# Patient Record
Sex: Female | Born: 1935 | Race: White | Hispanic: No | Marital: Married | Smoking: Never smoker
Health system: Southern US, Community
[De-identification: ages and names within clinical notes are randomized; demographics above are authoritative.]

## PROBLEM LIST (undated history)

## (undated) DIAGNOSIS — F418 Other specified anxiety disorders: Secondary | ICD-10-CM

## (undated) DIAGNOSIS — I10 Essential (primary) hypertension: Secondary | ICD-10-CM

## (undated) DIAGNOSIS — K219 Gastro-esophageal reflux disease without esophagitis: Secondary | ICD-10-CM

## (undated) DIAGNOSIS — M353 Polymyalgia rheumatica: Secondary | ICD-10-CM

## (undated) DIAGNOSIS — F419 Anxiety disorder, unspecified: Secondary | ICD-10-CM

## (undated) HISTORY — PX: CHOLECYSTECTOMY: SHX55

## (undated) HISTORY — PX: ABDOMINAL HYSTERECTOMY: SHX81

---

## 2015-09-20 ENCOUNTER — Encounter (HOSPITAL_BASED_OUTPATIENT_CLINIC_OR_DEPARTMENT_OTHER): Payer: Self-pay | Admitting: Emergency Medicine

## 2015-09-20 ENCOUNTER — Emergency Department (HOSPITAL_BASED_OUTPATIENT_CLINIC_OR_DEPARTMENT_OTHER)
Admission: EM | Admit: 2015-09-20 | Discharge: 2015-09-20 | Disposition: A | Discharge status: Home / Self Care | Payer: Medicare Other | Attending: Emergency Medicine | Admitting: Emergency Medicine

## 2015-09-20 ENCOUNTER — Other Ambulatory Visit: Payer: Self-pay

## 2015-09-20 ENCOUNTER — Emergency Department (HOSPITAL_BASED_OUTPATIENT_CLINIC_OR_DEPARTMENT_OTHER): Payer: Medicare Other

## 2015-09-20 DIAGNOSIS — I10 Essential (primary) hypertension: Secondary | ICD-10-CM | POA: Diagnosis not present

## 2015-09-20 DIAGNOSIS — R0602 Shortness of breath: Secondary | ICD-10-CM | POA: Insufficient documentation

## 2015-09-20 DIAGNOSIS — R002 Palpitations: Secondary | ICD-10-CM | POA: Diagnosis present

## 2015-09-20 DIAGNOSIS — R0789 Other chest pain: Secondary | ICD-10-CM | POA: Diagnosis not present

## 2015-09-20 HISTORY — DX: Essential (primary) hypertension: I10

## 2015-09-20 LAB — COMPREHENSIVE METABOLIC PANEL
ALBUMIN: 4 g/dL (ref 3.5–5.0)
ALK PHOS: 64 U/L (ref 38–126)
ALT: 24 U/L (ref 14–54)
ANION GAP: 10 (ref 5–15)
AST: 23 U/L (ref 15–41)
BUN: 16 mg/dL (ref 6–20)
CALCIUM: 9.1 mg/dL (ref 8.9–10.3)
CO2: 24 mmol/L (ref 22–32)
Chloride: 99 mmol/L — ABNORMAL LOW (ref 101–111)
Creatinine, Ser: 1.28 mg/dL — ABNORMAL HIGH (ref 0.44–1.00)
GFR calc Af Amer: 43 mL/min — ABNORMAL LOW (ref >60)
GFR calc non Af Amer: 37 mL/min — ABNORMAL LOW (ref >60)
GLUCOSE: 135 mg/dL — AB (ref 65–99)
Potassium: 4.1 mmol/L (ref 3.5–5.1)
SODIUM: 133 mmol/L — AB (ref 135–145)
Total Bilirubin: 0.6 mg/dL (ref 0.3–1.2)
Total Protein: 7.4 g/dL (ref 6.5–8.1)

## 2015-09-20 LAB — CBC WITH DIFFERENTIAL/PLATELET
Basophils Absolute: 0 10*3/uL (ref 0.0–0.1)
Basophils Relative: 0 %
EOS PCT: 2 %
Eosinophils Absolute: 0.2 10*3/uL (ref 0.0–0.7)
HEMATOCRIT: 31.9 % — AB (ref 36.0–46.0)
Hemoglobin: 10.7 g/dL — ABNORMAL LOW (ref 12.0–15.0)
LYMPHS ABS: 3.5 10*3/uL (ref 0.7–4.0)
LYMPHS PCT: 34 %
MCH: 30.4 pg (ref 26.0–34.0)
MCHC: 33.5 g/dL (ref 30.0–36.0)
MCV: 90.6 fL (ref 78.0–100.0)
MONO ABS: 0.9 10*3/uL (ref 0.1–1.0)
MONOS PCT: 8 %
Neutro Abs: 5.8 10*3/uL (ref 1.7–7.7)
Neutrophils Relative %: 56 %
Platelets: 318 10*3/uL (ref 150–400)
RBC: 3.52 MIL/uL — ABNORMAL LOW (ref 3.87–5.11)
RDW: 12.8 % (ref 11.5–15.5)
WBC: 10.4 10*3/uL (ref 4.0–10.5)

## 2015-09-20 LAB — MAGNESIUM: Magnesium: 2.2 mg/dL (ref 1.7–2.4)

## 2015-09-20 LAB — TROPONIN I: Troponin I: <0.03 ng/mL (ref <0.03)

## 2015-09-20 LAB — TSH: TSH: 2.374 u[IU]/mL (ref 0.350–4.500)

## 2015-09-20 MED ORDER — SODIUM CHLORIDE 0.9 % IV BOLUS (SEPSIS)
1000.0000 mL | Freq: Once | INTRAVENOUS | Status: AC
  Administered 2015-09-20: 1000 mL via INTRAVENOUS

## 2015-09-20 NOTE — Discharge Instructions (Signed)
Please see your PCP in 1-2 days for re-evaluation and discuss getting holter monitoring. Your prior stress test, ECHO, CT chest for work-up of this is reassuring. Please return for worsening symptoms, including passing out, worsening pain, difficulty breathing or any other symptoms concerning to you.

## 2015-09-20 NOTE — ED Notes (Signed)
Per Dr Verdie MosherLiu, repeat EKG scheduled for 1700 today along with repeat troponin.

## 2015-09-20 NOTE — ED Notes (Signed)
Patient transported to X-ray 

## 2015-09-20 NOTE — ED Notes (Signed)
Dr Liu in room with pt now. 

## 2015-09-20 NOTE — ED Provider Notes (Signed)
[facility] Provider Note   CSN: [id number] Arrival date & time: 09/20/15  1354  First Provider Contact:   First MD Initiated Contact with Patient 09/20/15 1515    By signing my name below, I, Levon Hedger, attest that this documentation has been prepared under the direction and in the presence of No att. providers found . Electronically Signed: Levon Hedger, Scribe. 09/21/2015. 1:01 AM.    History   Chief Complaint Chief Complaint  Patient presents with  . Chest Pain  . Palpitations    HPI Lindsay Sutton is a 82 y.o. female who presents to the Emergency Department complaining of intermittent, sudden onset, unchanging palpitations onset 3 months ago. Pt also notes associated chest tightness and mild dyspnea. Pt last experienced symptoms this morning at church and stated they lasted for an hour and have since resolved. Typically episodes not as long for several minutes. She denies any chest pain or palpitations during exam. In the past, pt has taken Xanax with some relief. She states symptoms are not exacerbated by exertion. Per pt, she has been to [facility] four times for the same and  admitted twice. There she was prescribed Norvasc and Lopressor and advised to follow up with her PCP following formal US and . Her PCP changed her medication to Bystolic and Benicar which she is currently taking. Pt was given an [place] and a cardiac stress test which were normal. She denies lightheadedness, nausea, or diaphoresis.   The history is provided by the patient. No language interpreter was used.  Palpitations   Associated symptoms include chest pain and shortness of breath. Pertinent negatives include no diaphoresis and no nausea.    Past Medical History:  Diagnosis Date  . Hypertension     There are no active problems to display for this patient.   Past Surgical History:  Procedure Laterality Date  . ABDOMINAL HYSTERECTOMY    . CHOLECYSTECTOMY      OB History    No data available       Home Medications    Prior to Admission medications   Not on File    Family History History reviewed. No pertinent family history.  Social History Social History  Substance Use Topics  . Smoking status: Never Smoker  . Smokeless tobacco: Never Used  . Alcohol use No     Allergies   Dilaudid [hydromorphone] and Morphine and related   Review of Systems Review of Systems  Constitutional: Negative for diaphoresis.  Respiratory: Positive for shortness of breath.   Cardiovascular: Positive for chest pain and palpitations.  Gastrointestinal: Negative for nausea.  Neurological: Negative for light-headedness.   Physical Exam Updated Vital Signs BP 159/89 (BP Location: Right Arm)   Pulse 65   Temp 98 F (36.7 C) (Oral)   Resp 18   Ht 5' (1.524 m)   Wt 160 lb (72.6 kg)   LMP  (LMP Unknown)   SpO2 100%   BMI 31.25 kg/m   Physical Exam Physical Exam  Nursing note and vitals reviewed. Constitutional: Well developed, well nourished, non-toxic, and in no acute distress Head: Normocephalic and atraumatic.  Mouth/Throat: Oropharynx is clear and moist.  Neck: Normal range of motion. Neck supple.  Cardiovascular: Normal rate and regular rhythm.  Trace bilateral pedal edema.  Pulmonary/Chest: Effort normal and breath sounds normal.  Abdominal: Soft. There is no tenderness. There is no rebound and no guarding.  Musculoskeletal: Normal range of motion.  Neurological: Alert, no facial droop, fluent speech,  moves all extremities symmetrically Skin: Skin is warm and dry.  Psychiatric: Cooperative   ED Treatments / Results  DIAGNOSTIC STUDIES:  Oxygen Saturation is 96% on RA, adequate by my interpretation.    COORDINATION OF CARE:  3:21 PM Will order DG chest. Discussed treatment plan with pt at bedside and pt agreed to plan.  Labs (all labs ordered are listed, but only abnormal results are displayed) Labs Reviewed  CBC WITH  DIFFERENTIAL/PLATELET - Abnormal; Notable for the following:       Result Value   RBC 3.52 (*)    Hemoglobin 10.7 (*)    HCT 31.9 (*)    All other components within normal limits  COMPREHENSIVE METABOLIC PANEL - Abnormal; Notable for the following:    Sodium 133 (*)    Chloride 99 (*)    Glucose, Bld 135 (*)    Creatinine, Ser 1.28 (*)    GFR calc non Af Amer 37 (*)    GFR calc Af Amer 43 (*)    All other components within normal limits  TROPONIN I  MAGNESIUM  TSH  TROPONIN I    EKG  EKG Interpretation  Date/Time:  Sunday September 20 2015 14:05:33 EDT Ventricular Rate:  86 PR Interval:  136 QRS Duration: 70 QT Interval:  340 QTC Calculation: 406 R Axis:   60 Text Interpretation:  Sinus rhythm with Premature supraventricular complexes Nonspecific T wave abnormality Abnormal ECG No old tracing to compare Confirmed by BELFI  MD, MELANIE ([id number]) on 09/20/2015 2:24:20 PM       Radiology Dg Chest 2 View  Result Date: 09/20/2015 CLINICAL DATA:  Chest pain EXAM: CHEST  2 VIEW COMPARISON:  None. FINDINGS: The heart size and mediastinal contours are within normal limits. Aortic atherosclerosis is noted. Both lungs are clear. The visualized skeletal structures are unremarkable. IMPRESSION: No active cardiopulmonary disease. Electronically Signed   By: Signa Kell M.D.   On: 09/20/2015 14:44    Procedures Procedures (including critical care time)  Medications Ordered in ED Medications  sodium chloride 0.9 % bolus 1,000 mL (0 mLs Intravenous Stopped 09/20/15 1630)     Initial Impression / Assessment and Plan / ED Course  I have reviewed the triage vital signs and the nursing notes.  Pertinent labs & imaging results that were available during my care of the patient were reviewed by me and considered in my medical decision making (see chart for details).  Clinical Course  Comment By Time  Chart reviewed and records from [facility] reviewed. The patient had 2 ED  visits in May 2017 for palpitations and chest tightness. She was admitted the beginning of May for this, and underwent echocardiogram showing LVH but otherwise unremarkable. She had a nuclear medicine stress test that was low risk and without any ischemic changes. She had a CT angiogram of the chest without any PE or other acute intrathoracic processes. She had a return visit at the end of May for recurrent symptoms, She was noted to have some mild hyponatremia and unremarkable cardiac markers. She has since then followed up with her primary care doctor regarding this issue, and has plans for Holter monitoring in the near future. Lavera Guise, MD 08/37 2564   82 year old female with history of HTN who presents with several month history of intermittent palpitations. Asymptomatic on presentation. Has had low risk NM stress test 2.5 months ago, unremarkable ECHO, and negative CT chest for PE. Heart score 3. Symptoms seems  atypical for ACS. Doubt dissection given ongoing symptoms for so long. Serial troponin negative and serial EKG without ischemic changes. Has had ongoing work-up for these symptoms. She is getting set up for holter monitoring with her PCP. She will follow-up with PCP in 1-2 days. Strict return and follow-up instructions reviewed. She expressed understanding of all discharge instructions and felt comfortable with the plan of care.     Final Clinical Impressions(s) / ED Diagnoses   Final diagnoses:  Palpitations  Chest tightness   I personally performed the services described in this documentation, which was scribed in my presence. The recorded information has been reviewed and is accurate.  New Prescriptions There are no discharge medications for this patient.    Lavera Guiseana Duo Cristyn Crossno, MD 09/21/15 [phone]

## 2015-09-20 NOTE — ED Triage Notes (Signed)
Pt c/o intermittent palpitations and chest pressure for 2 months but have been constant today since 1100. Pt is alert, interactive, in NAD.

## 2016-02-10 ENCOUNTER — Encounter (HOSPITAL_BASED_OUTPATIENT_CLINIC_OR_DEPARTMENT_OTHER): Payer: Self-pay

## 2016-02-10 ENCOUNTER — Emergency Department (HOSPITAL_BASED_OUTPATIENT_CLINIC_OR_DEPARTMENT_OTHER): Payer: Medicare Other

## 2016-02-10 ENCOUNTER — Inpatient Hospital Stay (HOSPITAL_BASED_OUTPATIENT_CLINIC_OR_DEPARTMENT_OTHER)
Admission: EM | Admit: 2016-02-10 | Discharge: 2016-02-13 | DRG: 871 | Disposition: A | Discharge status: Home / Self Care | Payer: Medicare Other | Attending: Internal Medicine | Admitting: Internal Medicine

## 2016-02-10 DIAGNOSIS — Z7952 Long term (current) use of systemic steroids: Secondary | ICD-10-CM

## 2016-02-10 DIAGNOSIS — F418 Other specified anxiety disorders: Secondary | ICD-10-CM | POA: Diagnosis present

## 2016-02-10 DIAGNOSIS — N390 Urinary tract infection, site not specified: Secondary | ICD-10-CM | POA: Diagnosis present

## 2016-02-10 DIAGNOSIS — R32 Unspecified urinary incontinence: Secondary | ICD-10-CM | POA: Diagnosis present

## 2016-02-10 DIAGNOSIS — R000 Tachycardia, unspecified: Secondary | ICD-10-CM | POA: Diagnosis present

## 2016-02-10 DIAGNOSIS — M353 Polymyalgia rheumatica: Secondary | ICD-10-CM | POA: Diagnosis present

## 2016-02-10 DIAGNOSIS — J209 Acute bronchitis, unspecified: Secondary | ICD-10-CM | POA: Diagnosis present

## 2016-02-10 DIAGNOSIS — E860 Dehydration: Secondary | ICD-10-CM | POA: Diagnosis present

## 2016-02-10 DIAGNOSIS — R6510 Systemic inflammatory response syndrome (SIRS) of non-infectious origin without acute organ dysfunction: Secondary | ICD-10-CM

## 2016-02-10 DIAGNOSIS — J207 Acute bronchitis due to echovirus: Secondary | ICD-10-CM | POA: Diagnosis not present

## 2016-02-10 DIAGNOSIS — B961 Klebsiella pneumoniae [K. pneumoniae] as the cause of diseases classified elsewhere: Secondary | ICD-10-CM | POA: Diagnosis present

## 2016-02-10 DIAGNOSIS — Z8249 Family history of ischemic heart disease and other diseases of the circulatory system: Secondary | ICD-10-CM | POA: Diagnosis not present

## 2016-02-10 DIAGNOSIS — N179 Acute kidney failure, unspecified: Secondary | ICD-10-CM | POA: Diagnosis present

## 2016-02-10 DIAGNOSIS — R69 Illness, unspecified: Secondary | ICD-10-CM

## 2016-02-10 DIAGNOSIS — Z90710 Acquired absence of both cervix and uterus: Secondary | ICD-10-CM

## 2016-02-10 DIAGNOSIS — A419 Sepsis, unspecified organism: Secondary | ICD-10-CM | POA: Diagnosis not present

## 2016-02-10 DIAGNOSIS — B9789 Other viral agents as the cause of diseases classified elsewhere: Secondary | ICD-10-CM | POA: Diagnosis present

## 2016-02-10 DIAGNOSIS — E871 Hypo-osmolality and hyponatremia: Secondary | ICD-10-CM | POA: Diagnosis present

## 2016-02-10 DIAGNOSIS — I10 Essential (primary) hypertension: Secondary | ICD-10-CM | POA: Diagnosis present

## 2016-02-10 DIAGNOSIS — Z79899 Other long term (current) drug therapy: Secondary | ICD-10-CM | POA: Diagnosis not present

## 2016-02-10 DIAGNOSIS — J9801 Acute bronchospasm: Secondary | ICD-10-CM | POA: Diagnosis not present

## 2016-02-10 DIAGNOSIS — Z8052 Family history of malignant neoplasm of bladder: Secondary | ICD-10-CM | POA: Diagnosis not present

## 2016-02-10 DIAGNOSIS — R6520 Severe sepsis without septic shock: Secondary | ICD-10-CM | POA: Diagnosis present

## 2016-02-10 DIAGNOSIS — J111 Influenza due to unidentified influenza virus with other respiratory manifestations: Secondary | ICD-10-CM | POA: Insufficient documentation

## 2016-02-10 DIAGNOSIS — K219 Gastro-esophageal reflux disease without esophagitis: Secondary | ICD-10-CM | POA: Diagnosis present

## 2016-02-10 DIAGNOSIS — Z9049 Acquired absence of other specified parts of digestive tract: Secondary | ICD-10-CM | POA: Diagnosis not present

## 2016-02-10 DIAGNOSIS — J9601 Acute respiratory failure with hypoxia: Secondary | ICD-10-CM | POA: Diagnosis present

## 2016-02-10 DIAGNOSIS — D72829 Elevated white blood cell count, unspecified: Secondary | ICD-10-CM

## 2016-02-10 DIAGNOSIS — J9600 Acute respiratory failure, unspecified whether with hypoxia or hypercapnia: Secondary | ICD-10-CM | POA: Insufficient documentation

## 2016-02-10 HISTORY — DX: Anxiety disorder, unspecified: F41.9

## 2016-02-10 HISTORY — DX: Other specified anxiety disorders: F41.8

## 2016-02-10 HISTORY — DX: Gastro-esophageal reflux disease without esophagitis: K21.9

## 2016-02-10 HISTORY — DX: Polymyalgia rheumatica: M35.3

## 2016-02-10 LAB — URINALYSIS, ROUTINE W REFLEX MICROSCOPIC
Glucose, UA: NEGATIVE mg/dL
HGB URINE DIPSTICK: NEGATIVE
KETONES UR: 15 mg/dL — AB
Nitrite: NEGATIVE
PROTEIN: NEGATIVE mg/dL
Specific Gravity, Urine: 1.024 (ref 1.005–1.030)
pH: 5.5 (ref 5.0–8.0)

## 2016-02-10 LAB — COMPREHENSIVE METABOLIC PANEL
ALBUMIN: 3.8 g/dL (ref 3.5–5.0)
ALK PHOS: 45 U/L (ref 38–126)
ALT: 20 U/L (ref 14–54)
AST: 23 U/L (ref 15–41)
Anion gap: 12 (ref 5–15)
BILIRUBIN TOTAL: 0.9 mg/dL (ref 0.3–1.2)
BUN: 18 mg/dL (ref 6–20)
CALCIUM: 9.2 mg/dL (ref 8.9–10.3)
CO2: 24 mmol/L (ref 22–32)
CREATININE: 1.14 mg/dL — AB (ref 0.44–1.00)
Chloride: 94 mmol/L — ABNORMAL LOW (ref 101–111)
GFR calc Af Amer: 49 mL/min — ABNORMAL LOW (ref >60)
GFR calc non Af Amer: 43 mL/min — ABNORMAL LOW (ref >60)
GLUCOSE: 132 mg/dL — AB (ref 65–99)
Potassium: 3.7 mmol/L (ref 3.5–5.1)
Sodium: 130 mmol/L — ABNORMAL LOW (ref 135–145)
TOTAL PROTEIN: 7.1 g/dL (ref 6.5–8.1)

## 2016-02-10 LAB — CBC WITH DIFFERENTIAL/PLATELET
BASOS ABS: 0 10*3/uL (ref 0.0–0.1)
Basophils Relative: 0 %
EOS ABS: 0 10*3/uL (ref 0.0–0.7)
Eosinophils Relative: 0 %
HCT: 37.1 % (ref 36.0–46.0)
Hemoglobin: 12.3 g/dL (ref 12.0–15.0)
Lymphocytes Relative: 30 %
Lymphs Abs: 6.1 10*3/uL — ABNORMAL HIGH (ref 0.7–4.0)
MCH: 31.1 pg (ref 26.0–34.0)
MCHC: 33.2 g/dL (ref 30.0–36.0)
MCV: 93.7 fL (ref 78.0–100.0)
Monocytes Absolute: 0.6 10*3/uL (ref 0.1–1.0)
Monocytes Relative: 3 %
NEUTROS ABS: 13.5 10*3/uL — AB (ref 1.7–7.7)
Neutrophils Relative %: 67 %
PLATELETS: 274 10*3/uL (ref 150–400)
RBC: 3.96 MIL/uL (ref 3.87–5.11)
RDW: 14.8 % (ref 11.5–15.5)
WBC: 20.2 10*3/uL — ABNORMAL HIGH (ref 4.0–10.5)

## 2016-02-10 LAB — I-STAT CG4 LACTIC ACID, ED
LACTIC ACID, VENOUS: 2.52 mmol/L — AB (ref 0.5–1.9)
Lactic Acid, Venous: 1.51 mmol/L (ref 0.5–1.9)
Lactic Acid, Venous: 2.64 mmol/L (ref 0.5–1.9)

## 2016-02-10 LAB — URINALYSIS, MICROSCOPIC (REFLEX)

## 2016-02-10 LAB — PROCALCITONIN: Procalcitonin: 0.22 ng/mL

## 2016-02-10 MED ORDER — ACETAMINOPHEN 325 MG PO TABS
650.0000 mg | ORAL_TABLET | Freq: Once | ORAL | Status: AC
  Administered 2016-02-10: 650 mg via ORAL
  Filled 2016-02-10: qty 2

## 2016-02-10 MED ORDER — CHLORPHENIRAMINE MALEATE 4 MG PO TABS
4.0000 mg | ORAL_TABLET | Freq: Four times a day (QID) | ORAL | Status: DC | PRN
  Filled 2016-02-10: qty 1

## 2016-02-10 MED ORDER — ZOLPIDEM TARTRATE 5 MG PO TABS: 5.0000 mg | ORAL_TABLET | Freq: Every evening | ORAL | Status: DC | PRN

## 2016-02-10 MED ORDER — DEXTROSE 5 % IV SOLN
1.0000 g | Freq: Three times a day (TID) | INTRAVENOUS | Status: DC
  Administered 2016-02-10 – 2016-02-13 (×9): 1 g via INTRAVENOUS
  Filled 2016-02-10 (×12): qty 1

## 2016-02-10 MED ORDER — VITAMIN D 1000 UNITS PO TABS
1000.0000 [IU] | ORAL_TABLET | Freq: Every day | ORAL | Status: DC
  Administered 2016-02-11 – 2016-02-13 (×3): 1000 [IU] via ORAL
  Filled 2016-02-10 (×3): qty 1

## 2016-02-10 MED ORDER — SODIUM CHLORIDE 0.9 % IV BOLUS (SEPSIS)
1000.0000 mL | Freq: Once | INTRAVENOUS | Status: AC
  Administered 2016-02-10: 1000 mL via INTRAVENOUS

## 2016-02-10 MED ORDER — ALBUTEROL SULFATE (2.5 MG/3ML) 0.083% IN NEBU
5.0000 mg | INHALATION_SOLUTION | Freq: Once | RESPIRATORY_TRACT | Status: AC
Start: 2016-02-10 — End: 2016-02-10
  Administered 2016-02-10: 2.5 mg via RESPIRATORY_TRACT
  Administered 2016-02-10: 5 mg via RESPIRATORY_TRACT
  Filled 2016-02-10: qty 6

## 2016-02-10 MED ORDER — AZTREONAM 1 G IJ SOLR
INTRAMUSCULAR | Status: AC
  Filled 2016-02-10: qty 1

## 2016-02-10 MED ORDER — ALBUTEROL SULFATE (2.5 MG/3ML) 0.083% IN NEBU
INHALATION_SOLUTION | RESPIRATORY_TRACT | Status: AC
  Filled 2016-02-10: qty 3

## 2016-02-10 MED ORDER — ESCITALOPRAM OXALATE 20 MG PO TABS
20.0000 mg | ORAL_TABLET | Freq: Every day | ORAL | Status: DC
  Administered 2016-02-11 – 2016-02-13 (×3): 20 mg via ORAL
  Filled 2016-02-10: qty 1
  Filled 2016-02-10 (×2): qty 2

## 2016-02-10 MED ORDER — ALBUTEROL SULFATE (2.5 MG/3ML) 0.083% IN NEBU: 5.0000 mg | INHALATION_SOLUTION | RESPIRATORY_TRACT | Status: DC | PRN

## 2016-02-10 MED ORDER — ALPRAZOLAM 0.5 MG PO TABS: 2.5000 mg | ORAL_TABLET | Freq: Three times a day (TID) | ORAL | Status: DC | PRN

## 2016-02-10 MED ORDER — DICYCLOMINE HCL 20 MG PO TABS: 20.0000 mg | ORAL_TABLET | Freq: Three times a day (TID) | ORAL | Status: DC | PRN

## 2016-02-10 MED ORDER — ONDANSETRON HCL 4 MG/2ML IJ SOLN
4.0000 mg | Freq: Once | INTRAMUSCULAR | Status: AC
  Administered 2016-02-10: 4 mg via INTRAVENOUS
  Filled 2016-02-10: qty 2

## 2016-02-10 MED ORDER — PANTOPRAZOLE SODIUM 40 MG PO TBEC
40.0000 mg | DELAYED_RELEASE_TABLET | Freq: Every day | ORAL | Status: DC
  Administered 2016-02-11 – 2016-02-13 (×3): 40 mg via ORAL
  Filled 2016-02-10 (×3): qty 1

## 2016-02-10 MED ORDER — IPRATROPIUM BROMIDE 0.02 % IN SOLN
0.5000 mg | Freq: Once | RESPIRATORY_TRACT | Status: AC
  Administered 2016-02-10: 0.5 mg via RESPIRATORY_TRACT
  Filled 2016-02-10: qty 2.5

## 2016-02-10 MED ORDER — ALBUTEROL SULFATE (2.5 MG/3ML) 0.083% IN NEBU
INHALATION_SOLUTION | RESPIRATORY_TRACT | Status: AC
  Administered 2016-02-10: 2.5 mg via RESPIRATORY_TRACT
  Filled 2016-02-10: qty 3

## 2016-02-10 MED ORDER — RISAQUAD PO CAPS
1.0000 | ORAL_CAPSULE | Freq: Every day | ORAL | Status: DC
  Administered 2016-02-11 – 2016-02-13 (×3): 1 via ORAL
  Filled 2016-02-10 (×3): qty 1

## 2016-02-10 MED ORDER — VANCOMYCIN HCL IN DEXTROSE 1-5 GM/200ML-% IV SOLN
1000.0000 mg | Freq: Once | INTRAVENOUS | Status: AC
  Administered 2016-02-10: 1000 mg via INTRAVENOUS
  Filled 2016-02-10: qty 200

## 2016-02-10 MED ORDER — SODIUM CHLORIDE 0.9 % IV SOLN
INTRAVENOUS | Status: DC
  Administered 2016-02-11: 01:00:00 via INTRAVENOUS

## 2016-02-10 MED ORDER — CALCIUM CARBONATE-VITAMIN D 500-200 MG-UNIT PO TABS
1.0000 | ORAL_TABLET | Freq: Two times a day (BID) | ORAL | Status: DC
  Administered 2016-02-11 – 2016-02-13 (×4): 1 via ORAL
  Filled 2016-02-10 (×5): qty 1

## 2016-02-10 MED ORDER — HYDRALAZINE HCL 20 MG/ML IJ SOLN
5.0000 mg | INTRAMUSCULAR | Status: DC | PRN
  Administered 2016-02-12: 5 mg via INTRAVENOUS
  Filled 2016-02-10: qty 1

## 2016-02-10 MED ORDER — NAPHAZOLINE-GLYCERIN 0.012-0.2 % OP SOLN: 1.0000 [drp] | Freq: Four times a day (QID) | OPHTHALMIC | Status: DC | PRN

## 2016-02-10 MED ORDER — IPRATROPIUM BROMIDE 0.02 % IN SOLN
0.5000 mg | RESPIRATORY_TRACT | Status: DC
  Administered 2016-02-11: 0.5 mg via RESPIRATORY_TRACT
  Filled 2016-02-10: qty 2.5

## 2016-02-10 MED ORDER — ASPIRIN 81 MG PO CHEW
81.0000 mg | CHEWABLE_TABLET | Freq: Every day | ORAL | Status: DC
  Administered 2016-02-11 – 2016-02-13 (×3): 81 mg via ORAL
  Filled 2016-02-10 (×3): qty 1

## 2016-02-10 MED ORDER — ACETAMINOPHEN 325 MG PO TABS
650.0000 mg | ORAL_TABLET | Freq: Four times a day (QID) | ORAL | Status: DC | PRN
  Administered 2016-02-11 – 2016-02-12 (×2): 650 mg via ORAL
  Filled 2016-02-10 (×2): qty 2

## 2016-02-10 MED ORDER — NEBIVOLOL HCL 2.5 MG PO TABS
1.2500 mg | ORAL_TABLET | Freq: Every day | ORAL | Status: DC
  Administered 2016-02-12 – 2016-02-13 (×2): 1.25 mg via ORAL
  Filled 2016-02-10 (×3): qty 1

## 2016-02-10 MED ORDER — ONDANSETRON HCL 4 MG/2ML IJ SOLN: 4.0000 mg | Freq: Three times a day (TID) | INTRAMUSCULAR | Status: DC | PRN

## 2016-02-10 MED ORDER — SODIUM CHLORIDE 0.9 % IV BOLUS (SEPSIS)
500.0000 mL | Freq: Once | INTRAVENOUS | Status: AC
  Administered 2016-02-10: 500 mL via INTRAVENOUS

## 2016-02-10 MED ORDER — DOCUSATE SODIUM 100 MG PO CAPS: 100.0000 mg | ORAL_CAPSULE | Freq: Two times a day (BID) | ORAL | Status: DC | PRN

## 2016-02-10 MED ORDER — ENOXAPARIN SODIUM 40 MG/0.4ML ~~LOC~~ SOLN
40.0000 mg | Freq: Every day | SUBCUTANEOUS | Status: DC
  Administered 2016-02-11 – 2016-02-13 (×3): 40 mg via SUBCUTANEOUS
  Filled 2016-02-10 (×3): qty 0.4

## 2016-02-10 NOTE — ED Notes (Signed)
Patient given crackers, PB, and sprite. Offered soup, declined.

## 2016-02-10 NOTE — H&P (Signed)
History and Physical    Lindsay Sutton MVH:8[name]:[id number] DOB: 10/08/1928 DOA: 02/10/2016  Referring MD/NP/PA:   PCP: No primary care provider on file.   Patient coming from:  The patient is coming from home.  At baseline, pt is independent for most of ADL.   Chief Complaint: Sore throat, cough, fever, shortness of breath  HPI: Lindsay Fateleanor Sutton is a 82 y.o. female with medical history significant of hypertension, GERD, depression, anxiety, PMR, who presents with sore throat, cough, fever, shortness of breath.  Patient states that she has been having sore throat, cough, fever, SOB for almost 2 weeks, which has worsened in the past several days. She coughs up brownish colored sputum. She also has runny nose and mild chest pain. The chest pain is located in the front chest, constant, 2/10, aggravated by coughing. She has nausea and vomited several times. No abdominal pain or diarrhea. Denies symptoms of UTI or unilateral weakness.  ED Course: pt was found to have WBC 20.2, lactate is up to 2.64, urinalysis with small moderate leukocyte, sodium 130, Cre 1.14, temperature 101.8, tachycardia, tachypnea, oxygen saturation 89% on room air. Chest x-ray showed bronchitis change. Patient is admitted to stepdown as inpatient.  Review of Systems:   General: has fevers, chills, no changes in body weight, has poor appetite, has fatigue HEENT: no blurry vision, hearing changes or sore throat Respiratory: has dyspnea, coughing, wheezing CV: has chest pain, no palpitations GI: has nausea, vomiting, no abdominal pain, diarrhea, constipation GU: no dysuria, burning on urination, increased urinary frequency, hematuria  Ext: no leg edema Neuro: no unilateral weakness, numbness, or tingling, no vision change or hearing loss Skin: no rash, no skin tear. MSK: No muscle spasm, no deformity, no limitation of range of movement in spin Heme: No easy bruising.  Travel history: No recent long distant  travel.  Allergy:  Allergies  Allergen Reactions  . Floxin [Ofloxacin] Anaphylaxis  . Ceftin [Cefuroxime Axetil]   . Ciprofloxacin   . Cymbalta [Duloxetine Hcl]   . Detrol [Tolterodine]   . Dilaudid [Hydromorphone] Itching  . Fish Allergy   . Levaquin [Levofloxacin]   . Mobic [Meloxicam]   . Pantoprazole   . Penicillins   . Percocet [Oxycodone-Acetaminophen]   . Rocephin [Ceftriaxone]   . Septra [Sulfamethoxazole-Trimethoprim]   . Sulfa Antibiotics   . Morphine And Related Rash    Past Medical History:  Diagnosis Date  . Anxiety   . Depression with anxiety   . GERD (gastroesophageal reflux disease)   . Hypertension   . Polymyalgia rheumatica (HCC)     Past Surgical History:  Procedure Laterality Date  . ABDOMINAL HYSTERECTOMY    . CHOLECYSTECTOMY      Social History:  reports that she has never smoked. She has never used smokeless tobacco. She reports that she does not drink alcohol or use drugs.  Family History:  Family History  Problem Relation Age of Onset  . Heart attack Father   . Bladder Cancer Sister      Prior to Admission medications   Medication Sig Start Date End Date Taking? Authorizing Provider  ALPRAZolam (XANAX PO) Take 2.5 mg by mouth 3 (three) times daily as needed.   Yes Historical Provider, MD  aspirin 81 MG chewable tablet Chew by mouth daily.   Yes Historical Provider, MD  Calcium-Magnesium-Vitamin D (CALCIUM 500 PO) Take 1 tablet by mouth 2 (two) times daily.   Yes Historical Provider, MD  chlorpheniramine (CHLOR-TRIMETON) 2 MG/5ML syrup Take 4 mg by  mouth as needed for allergies.   Yes Historical Provider, MD  cholecalciferol (VITAMIN D) 1000 units tablet Take 1,000 Units by mouth daily.   Yes Historical Provider, MD  dicyclomine (BENTYL) 20 MG tablet Take 20 mg by mouth as needed for spasms.   Yes Historical Provider, MD  diphenhydrAMINE (BENADRYL) 25 mg capsule Take 25 mg by mouth at bedtime as needed.   Yes Historical Provider, MD   Docusate Calcium (STOOL SOFTENER PO) Take by mouth as needed.   Yes Historical Provider, MD  escitalopram (LEXAPRO) 20 MG tablet Take 20 mg by mouth daily.   Yes Historical Provider, MD  Naphazoline HCl (NAPHCON OP) Apply 1-4 drops to eye daily.   Yes Historical Provider, MD  nebivolol (BYSTOLIC) 2.5 MG tablet Take 1.25 mg by mouth daily.   Yes Historical Provider, MD  olmesartan (BENICAR) 40 MG tablet Take 40 mg by mouth daily.   Yes Historical Provider, MD  omeprazole (PRILOSEC) 10 MG capsule Take 10 mg by mouth at bedtime.   Yes Historical Provider, MD  Probiotic Product (PROBIOTIC DAILY PO) Take by mouth.   Yes Historical Provider, MD  Psyllium (METAMUCIL FIBER PO) Take by mouth.   Yes Historical Provider, MD    Physical Exam: Vitals:   02/11/16 0300 02/11/16 0330 02/11/16 0400 02/11/16 0409  BP: (!) 169/61 (!) 183/58 (!) 145/57   Pulse: 65 72 94   Resp: 20 19 17    Temp:    100 F (37.8 C)  TempSrc:    Oral  SpO2: 92% 94% 93% 97%  Weight:      Height:       General: Not in acute distress HEENT:       Eyes: PERRL, EOMI, no scleral icterus.       ENT: No discharge from the ears and nose, no pharynx injection, no tonsillar enlargement.        Neck: No JVD, no bruit, no mass felt. Heme: No neck lymph node enlargement. Cardiac: S1/S2, RRR, No murmurs, No gallops or rubs. Respiratory: Has wheezing and rhonchi bilaterally. No rales or rubs. GI: Soft, nondistended, nontender, no rebound pain, no organomegaly, BS present. GU: No hematuria Ext: No pitting leg edema bilaterally. 2+DP/PT pulse bilaterally. Musculoskeletal: No joint deformities, No joint redness or warmth, no limitation of ROM in spin. Skin: No rashes.  Neuro: Alert, oriented X3, cranial nerves II-XII grossly intact, moves all extremities normally.   Psych: Patient is not psychotic, no suicidal or hemocidal ideation.  Labs on Admission: I have personally reviewed following labs and imaging studies  CBC:  Recent  Labs Lab 02/10/16 1358  WBC 20.2*  NEUTROABS 13.5*  HGB 12.3  HCT 37.1  MCV 93.7  PLT 274   Basic Metabolic Panel:  Recent Labs Lab 02/10/16 1358  NA 130*  K 3.7  CL 94*  CO2 24  GLUCOSE 132*  BUN 18  CREATININE 1.14*  CALCIUM 9.2   GFR: Estimated Creatinine Clearance: 32.9 mL/min (by C-G formula based on SCr of 1.14 mg/dL (H)). Liver Function Tests:  Recent Labs Lab 02/10/16 1358  AST 23  ALT 20  ALKPHOS 45  BILITOT 0.9  PROT 7.1  ALBUMIN 3.8   No results for input(s): LIPASE, AMYLASE in the last 168 hours. No results for input(s): AMMONIA in the last 168 hours. Coagulation Profile:  Recent Labs Lab 02/11/16 0004  INR 1.23   Cardiac Enzymes: No results for input(s): CKTOTAL, CKMB, CKMBINDEX, TROPONINI in the last 168 hours. BNP (last 3  results) No results for input(s): PROBNP in the last 8760 hours. HbA1C: No results for input(s): HGBA1C in the last 72 hours. CBG: No results for input(s): GLUCAP in the last 168 hours. Lipid Profile: No results for input(s): CHOL, HDL, LDLCALC, TRIG, CHOLHDL, LDLDIRECT in the last 72 hours. Thyroid Function Tests:  Recent Labs  02/11/16 0231  TSH 2.187   Anemia Panel: No results for input(s): VITAMINB12, FOLATE, FERRITIN, TIBC, IRON, RETICCTPCT in the last 72 hours. Urine analysis:    Component Value Date/Time   COLORURINE AMBER (A) 02/10/2016 1350   APPEARANCEUR CLOUDY (A) 02/10/2016 1350   LABSPEC 1.024 02/10/2016 1350   PHURINE 5.5 02/10/2016 1350   GLUCOSEU NEGATIVE 02/10/2016 1350   HGBUR NEGATIVE 02/10/2016 1350   BILIRUBINUR SMALL (A) 02/10/2016 1350   KETONESUR 15 (A) 02/10/2016 1350   PROTEINUR NEGATIVE 02/10/2016 1350   NITRITE NEGATIVE 02/10/2016 1350   LEUKOCYTESUR SMALL (A) 02/10/2016 1350   Sepsis Labs: @LABRCNTIP (procalcitonin:4,lacticidven:4) ) Recent Results (from the past 240 hour(s))  Blood Culture (routine x 2)     Status: None (Preliminary result)   Collection Time: 02/10/16   1:50 PM  Result Value Ref Range Status   Specimen Description   Final    BLOOD RIGHT ANTECUBITAL [facility]    Special Requests BOTTLES DRAWN AEROBIC AND ANAEROBIC [facility] EACH  Final   Culture PENDING  Incomplete   Report Status PENDING  Incomplete  Blood Culture (routine x 2)     Status: None (Preliminary result)   Collection Time: 02/10/16  2:03 PM  Result Value Ref Range Status   Specimen Description   Final    BLOOD LEFT FOREARM [facility]formed [facility]    Special Requests BOTTLES DRAWN AEROBIC AND ANAEROBIC [facility] EACH  Final   Culture PENDING  Incomplete   Report Status PENDING  Incomplete  MRSA PCR Screening     Status: None   Collection Time: 02/10/16 10:51 PM  Result Value Ref Range Status   MRSA by PCR NEGATIVE NEGATIVE Final    Comment:        The GeneXpert MRSA Assay (FDA approved for NASAL specimens only), is one component of a comprehensive MRSA colonization surveillance program. It is not intended to diagnose MRSA infection nor to guide or monitor treatment for MRSA infections.      Radiological Exams on Admission: Dg Chest 2 View  Result Date: 02/10/2016 CLINICAL DATA:  Sore throat, cough and chills for 5-6 days. EXAM: CHEST  2 VIEW COMPARISON:  09/20/2015 FINDINGS: The cardiac silhouette, mediastinal and hilar contours are within normal limits and stable. Chronic bronchitic type lung changes but no definite acute overlying pulmonary process. Stable mild eventration of both hemidiaphragms. The bony thorax is intact. IMPRESSION: Mild chronic bronchitic type lung changes but no definite acute overlying pulmonary process. Electronically Signed   By: Rudie MeyerP.  Gallerani M.D.   On: 02/10/2016 12:50     EKG: Independently reviewed.  Not done in ED, will get one.   Assessment/Plan Principal Problem:   Acute respiratory failure with hypoxia (HCC) Active Problems:   Acute bronchitis   GERD (gastroesophageal reflux disease)   Hypertension    Depression with anxiety   AKI (acute kidney injury) (HCC)   Hyponatremia   Acute respiratory failure with hypoxia due to acute bronchitis and sepsis:  Pt is septic with leukocytosis, elevated lactate, fever, tachycardia and tachypnea. Hemodynamically stable.  - Will admit to SDU as inpt. - IV Vancomycin and Aztreonam.  -  Solu-Medrol 60 mg twice a day -Check rapid strep - Mucinex for cough  - prn Albuterol Nebs, atrovent for SOB - Urine S. pneumococcal antigen - Follow up blood culture x2, sputum culture and respiratory virus panel, plus Flu pcr - will get Procalcitonin and trend lactic acid level per sepsis protocol - IVF: 2.5L of NS bolus in ED, followed by 75 mL per hour of NS  GERD: -Protonix  Depression and anxiety: Stable, no suicidal or homicidal ideations. -Continue home medications: Xanax, Lexapro  GERD: -Protonix  HTN: -hold Nenica due to AKI and hyponatremia -continue bystolic -IV hydralazine when necessary  Mild AKI: Cre 1.14, Likely due to prerenal secondary to dehydration and continuation of ARB - IVF as above - Follow up renal function by BMP - Hold Benica  Hyponatremia: Most likely due to poor oral intake. - Will check urine sodium, urine osmolality, serum osmolality. - check TSH - IVF as above - check BMP in AM. - hold Benica  DVT ppx: SQ Lovenox Code Status: Full code Family Communication: None at bed side. Disposition Plan:  Anticipate discharge back to previous home environment Consults called:  none Admission status: SDU/inpation       Date of Service 02/11/2016    Lorretta Harp [facility] Pager [phone]  If 7PM-7AM, please contact night-coverage [email or web address] Password [facility] 02/11/2016, 5:39 AM

## 2016-02-10 NOTE — ED Triage Notes (Addendum)
C/o cold s/s-started approx 1-2 weeks ago with sore throat-vomiting started this am-NAD-presents to triage in w/c

## 2016-02-10 NOTE — ED Notes (Signed)
Please call patient daughter when room assignment becomes available.   Lindsay Sutton (cell) [phone] (home) [phone]  PASSWORD: "Greggory StallionGeorge"

## 2016-02-10 NOTE — ED Notes (Signed)
Pt sleeping, resp even/unlabored; sats decreased to 88-89% RA

## 2016-02-10 NOTE — Progress Notes (Signed)
Pharmacy Antibiotic Note  Lindsay Sutton is a 82 y.o. female admitted on 02/10/2016 with PNA.  Pharmacy has been consulted for Vancomycin and Aztreonam dosing. Estimated CrCl ~40 ml/min.  [facility]1730; Aztreonam 1gm IV given ~2145  Plan: Aztreonam 1gm IV q8h Vancomycin 750mg  IV q12h Will f/u micro data, renal function, and pt's clinical condition Vanc trough prn    Temp (24hrs), Avg:100.2 F (37.9 C), Min:99 F (37.2 C), Max:101.8 F (38.8 C)   Recent Labs Lab 02/10/16 1356 02/10/16 1358 02/10/16 1647 02/10/16 1946  WBC  --  20.2*  --   --   CREATININE  --  1.14*  --   --   LATICACIDVEN 1.51  --  2.52* 2.64*    CrCl cannot be calculated (Unknown ideal weight.).    Allergies  Allergen Reactions  . Floxin [Ofloxacin] Anaphylaxis  . Ceftin [Cefuroxime Axetil]   . Ciprofloxacin   . Cymbalta [Duloxetine Hcl]   . Detrol [Tolterodine]   . Dilaudid [Hydromorphone] Itching  . Fish Allergy   . Levaquin [Levofloxacin]   . Mobic [Meloxicam]   . Pantoprazole   . Penicillins   . Percocet [Oxycodone-Acetaminophen]   . Rocephin [Ceftriaxone]   . Septra [Sulfamethoxazole-Trimethoprim]   . Sulfa Antibiotics   . Morphine And Related Rash    Antimicrobials this admission: 12/26 Vanc >>  12/26 Aztreonam >>   Dose adjustments this admission: n/a  Microbiology results: 12/27 BCx x2:  12/27 UCx:    Sputum:    MRSA PCR:   Thank you for allowing pharmacy to be a part of this patient's care.  Christoper Fabianaron Raenah Murley, PharmD, BCPS Clinical pharmacist, pager [phone] 02/10/2016 11:59 PM

## 2016-02-10 NOTE — ED Provider Notes (Signed)
[facility] Provider Note   CSN: [id number] Arrival date & time: 02/10/16  1202     History   Chief Complaint Chief Complaint  Patient presents with  . Cough    HPI Lindsay Sutton is a 82 y.o. female.  HPI  Pt presenting with c/o cough, congestion over the past 5-6 days.  This morning she began to have vomiting- states she has not been drinking much over the past several days and today is not able to keep down liquids.  No abdominal pain, no chest pain.  No difficulty breathing.  She did not know she has a fever, but is 101 here in the ED.  She states today she felt very weak and fatigued.  Her cough is productive of sputum.   Immunizations are up to date.  No recent travel.  There are no other associated systemic symptoms, there are no other alleviating or modifying factors.   Past Medical History:  Diagnosis Date  . Hypertension   . Polymyalgia rheumatica [facility](HCC)     Patient Active Problem List   Diagnosis Date Noted  . Influenza-like illness 02/10/2016    Past Surgical History:  Procedure Laterality Date  . ABDOMINAL HYSTERECTOMY    . CHOLECYSTECTOMY      OB History    No data available       Home Medications    Prior to Admission medications   Medication Sig Start Date End Date Taking? Authorizing Provider  ALPRAZolam (XANAX PO) Take 2.5 mg by mouth 3 (three) times daily as needed.   Yes Historical Provider, MD  aspirin 81 MG chewable tablet Chew by mouth daily.   Yes Historical Provider, MD  Calcium-Magnesium-Vitamin D (CALCIUM 500 PO) Take 1 tablet by mouth 2 (two) times daily.   Yes Historical Provider, MD  chlorpheniramine (CHLOR-TRIMETON) 2 MG/5ML syrup Take 4 mg by mouth as needed for allergies.   Yes Historical Provider, MD  cholecalciferol (VITAMIN D) 1000 units tablet Take 1,000 Units by mouth daily.   Yes Historical Provider, MD  dicyclomine (BENTYL) 20 MG tablet Take 20 mg by mouth as needed for spasms.   Yes Historical Provider, MD    diphenhydrAMINE (BENADRYL) 25 mg capsule Take 25 mg by mouth at bedtime as needed.   Yes Historical Provider, MD  Docusate Calcium (STOOL SOFTENER PO) Take by mouth as needed.   Yes Historical Provider, MD  escitalopram (LEXAPRO) 20 MG tablet Take 20 mg by mouth daily.   Yes Historical Provider, MD  Naphazoline HCl (NAPHCON OP) Apply 1-4 drops to eye daily.   Yes Historical Provider, MD  nebivolol (BYSTOLIC) 2.5 MG tablet Take 1.25 mg by mouth daily.   Yes Historical Provider, MD  olmesartan (BENICAR) 40 MG tablet Take 40 mg by mouth daily.   Yes Historical Provider, MD  omeprazole (PRILOSEC) 10 MG capsule Take 10 mg by mouth at bedtime.   Yes Historical Provider, MD  Probiotic Product (PROBIOTIC DAILY PO) Take by mouth.   Yes Historical Provider, MD  Psyllium (METAMUCIL FIBER PO) Take by mouth.   Yes Historical Provider, MD    Family History No family history on file.  Social History Social History  Substance Use Topics  . Smoking status: Never Smoker  . Smokeless tobacco: Never Used  . Alcohol use No     Allergies   Floxin [ofloxacin]; Ceftin [cefuroxime axetil]; Ciprofloxacin; Cymbalta [duloxetine hcl]; Detrol [tolterodine]; Dilaudid [hydromorphone]; Fish allergy; Levaquin [levofloxacin]; Mobic [meloxicam]; Pantoprazole; Penicillins; Percocet [oxycodone-acetaminophen]; Rocephin [ceftriaxone]; Septra [sulfamethoxazole-trimethoprim];  Sulfa antibiotics; and Morphine and related   Review of Systems Review of Systems  ROS reviewed and all otherwise negative except for mentioned in HPI   Physical Exam Updated Vital Signs BP 117/56   Pulse 116   Temp 99.5 F (37.5 C) (Oral)   Resp 25   LMP  (LMP Unknown)   SpO2 94%  Vitals reviewed Physical Exam Physical Examination: General appearance - alert, well appearing, and in no distress Mental status - alert, oriented to person, place, and time Eyes - no conjunctival injection, no scleral icterus Mouth - mucous membranes dry, OP  clear Neck - supple, no significant adenopathy Chest - BSS, bilateral wheezing, normal respiratory effort Heart - tachycardic rate, regular rhythm, normal S1, S2, no murmurs, rubs, clicks or gallops Abdomen - soft, nontender, nondistended, no masses or organomegaly, nabs Neurological - alert, oriented, normal speech Extremities - peripheral pulses normal, no pedal edema, no clubbing or cyanosis Skin - normal coloration and turgor, no rashes  ED Treatments / Results  Labs (all labs ordered are listed, but only abnormal results are displayed) Labs Reviewed  COMPREHENSIVE METABOLIC PANEL - Abnormal; Notable for the following:       Result Value   Sodium 130 (*)    Chloride 94 (*)    Glucose, Bld 132 (*)    Creatinine, Ser 1.14 (*)    GFR calc non Af Amer 43 (*)    GFR calc Af Amer 49 (*)    All other components within normal limits  CBC WITH DIFFERENTIAL/PLATELET - Abnormal; Notable for the following:    WBC 20.2 (*)    Neutro Abs 13.5 (*)    Lymphs Abs 6.1 (*)    All other components within normal limits  URINALYSIS, ROUTINE W REFLEX MICROSCOPIC - Abnormal; Notable for the following:    Color, Urine AMBER (*)    APPearance CLOUDY (*)    Bilirubin Urine SMALL (*)    Ketones, ur 15 (*)    Leukocytes, UA SMALL (*)    All other components within normal limits  URINALYSIS, MICROSCOPIC (REFLEX) - Abnormal; Notable for the following:    Bacteria, UA FEW (*)    Squamous Epithelial / LPF 6-30 (*)    All other components within normal limits  CULTURE, BLOOD (ROUTINE X 2)  CULTURE, BLOOD (ROUTINE X 2)  URINE CULTURE  RESPIRATORY PANEL BY PCR  PROCALCITONIN  I-STAT CG4 LACTIC ACID, ED  I-STAT CG4 LACTIC ACID, ED    EKG  EKG Interpretation  Date/Time:  Wednesday February 10 2016 12:53:10 EST Ventricular Rate:  106 PR Interval:    QRS Duration: 74 QT Interval:  314 QTC Calculation: 417 R Axis:   49 Text Interpretation:  Sinus tachycardia Atrial premature complexes Nonspecific  repol abnormality, lateral leads Since previous tracing rate faster, no other acute changes Confirmed by Karma GanjaLINKER  MD, Edwyna Dangerfield [phone]([id number]) on 02/10/2016 1:12:27 PM       Radiology Dg Chest 2 View  Result Date: 02/10/2016 CLINICAL DATA:  Sore throat, cough and chills for 5-6 days. EXAM: CHEST  2 VIEW COMPARISON:  09/20/2015 FINDINGS: The cardiac silhouette, mediastinal and hilar contours are within normal limits and stable. Chronic bronchitic type lung changes but no definite acute overlying pulmonary process. Stable mild eventration of both hemidiaphragms. The bony thorax is intact. IMPRESSION: Mild chronic bronchitic type lung changes but no definite acute overlying pulmonary process. Electronically Signed   By: Rudie MeyerP.  Gallerani M.D.   On: 02/10/2016 12:50    Procedures  Procedures (including critical care time)  Medications Ordered in ED Medications  vancomycin (VANCOCIN) IVPB 1000 mg/200 mL premix (1,000 mg Intravenous New Bag/Given 02/10/16 1627)  aztreonam (AZACTAM) 1 g in dextrose 5 % 50 mL IVPB (0 g Intravenous Stopped 02/10/16 1610)  aztreonam (AZACTAM) 1 g injection (not administered)  acetaminophen (TYLENOL) tablet 650 mg (650 mg Oral Given 02/10/16 1225)  sodium chloride 0.9 % bolus 500 mL (0 mLs Intravenous Stopped 02/10/16 1414)  ondansetron (ZOFRAN) injection 4 mg (4 mg Intravenous Given 02/10/16 1412)  albuterol (PROVENTIL) (2.5 MG/3ML) 0.083% nebulizer solution 5 mg (5 mg Nebulization Given 02/10/16 1431)  ipratropium (ATROVENT) nebulizer solution 0.5 mg (0.5 mg Nebulization Given 02/10/16 1431)     Initial Impression / Assessment and Plan / ED Course  I have reviewed the triage vital signs and the nursing notes.  Pertinent labs & imaging results that were available during my care of the patient were reviewed by me and considered in my medical decision making (see chart for details).  Clinical Course   CRITICAL CARE Performed by: Ethelda Chick Total critical care time: 45  minutes Critical care time was exclusive of separately billable procedures and treating other patients. Critical care was necessary to treat or prevent imminent or life-threatening deterioration. Critical care was time spent personally by me on the following activities: development of treatment plan with patient and/or surrogate as well as nursing, discussions with consultants, evaluation of patient's response to treatment, examination of patient, obtaining history from patient or surrogate, ordering and performing treatments and interventions, ordering and review of laboratory studies, ordering and review of radiographic studies, pulse oximetry and re-evaluation of patient's condition.   1:43 PM code sepsis initiated due to HR and fever- however all symptoms are most likely viral in nature-CXR is negative for pneumonia.  Pt has numerous abx allergies- will hold on antibioitics for now- -- will reassess once further labs, urine are resulted.    3:12 PM WBC is elevated at 20K, calling pharmacy now for assistance with abx selection due to so many patient allergies.    3:20 PM d/w pharmacy and decision made to start vanc and aztreonam - most abx classes are not available due to allergy- for respiratory coverage as this the most likely source.    3:54 PM d/w Dr. Konrad Dolores for admission to [facility] to hospitalist service.  He requests stepdown bed, respiratory viral panel as well as procalcitonin - ordered.      Final Clinical Impressions(s) / ED Diagnoses   Final diagnoses:  Influenza-like illness  Bronchospasm  Leukocytosis, unspecified type  SIRS (systemic inflammatory response syndrome) (HCC)    New Prescriptions New Prescriptions   No medications on file     Jerelyn Scott, MD 02/10/16 1713

## 2016-02-10 NOTE — ED Notes (Signed)
ED Provider at bedside. 

## 2016-02-10 NOTE — Progress Notes (Signed)
   Patient coming [facility] for treatment of acute respiratory failure secondary to community acquired pneumonia with SIRS. [facility]  Shelly Flattenavid Shamarion Coots, MD [facility] 02/10/2016, 3:46 PM

## 2016-02-11 ENCOUNTER — Encounter (HOSPITAL_COMMUNITY): Payer: Self-pay

## 2016-02-11 LAB — BLOOD CULTURE ID PANEL (REFLEXED)
Acinetobacter baumannii: NOT DETECTED
CANDIDA GLABRATA: NOT DETECTED
CANDIDA TROPICALIS: NOT DETECTED
Candida albicans: NOT DETECTED
Candida krusei: NOT DETECTED
Candida parapsilosis: NOT DETECTED
Enterobacter cloacae complex: NOT DETECTED
Enterobacteriaceae species: NOT DETECTED
Enterococcus species: NOT DETECTED
Escherichia coli: NOT DETECTED
HAEMOPHILUS INFLUENZAE: NOT DETECTED
KLEBSIELLA PNEUMONIAE: NOT DETECTED
Klebsiella oxytoca: NOT DETECTED
Listeria monocytogenes: NOT DETECTED
Methicillin resistance: NOT DETECTED
NEISSERIA MENINGITIDIS: NOT DETECTED
Proteus species: NOT DETECTED
Pseudomonas aeruginosa: NOT DETECTED
SERRATIA MARCESCENS: NOT DETECTED
STAPHYLOCOCCUS SPECIES: DETECTED — AB
STREPTOCOCCUS SPECIES: NOT DETECTED
Staphylococcus aureus (BCID): NOT DETECTED
Streptococcus agalactiae: NOT DETECTED
Streptococcus pneumoniae: NOT DETECTED
Streptococcus pyogenes: NOT DETECTED

## 2016-02-11 LAB — BASIC METABOLIC PANEL
Anion gap: 7 (ref 5–15)
BUN: 15 mg/dL (ref 6–20)
CHLORIDE: 102 mmol/L (ref 101–111)
CO2: 23 mmol/L (ref 22–32)
CREATININE: 1.04 mg/dL — AB (ref 0.44–1.00)
Calcium: 7.8 mg/dL — ABNORMAL LOW (ref 8.9–10.3)
GFR calc Af Amer: 55 mL/min — ABNORMAL LOW (ref >60)
GFR calc non Af Amer: 48 mL/min — ABNORMAL LOW (ref >60)
Glucose, Bld: 96 mg/dL (ref 65–99)
Potassium: 4.3 mmol/L (ref 3.5–5.1)
Sodium: 132 mmol/L — ABNORMAL LOW (ref 135–145)

## 2016-02-11 LAB — RESPIRATORY PANEL BY PCR
ADENOVIRUS-RVPPCR: NOT DETECTED
Bordetella pertussis: NOT DETECTED
CORONAVIRUS 229E-RVPPCR: NOT DETECTED
CORONAVIRUS HKU1-RVPPCR: NOT DETECTED
CORONAVIRUS NL63-RVPPCR: NOT DETECTED
CORONAVIRUS OC43-RVPPCR: DETECTED — AB
Chlamydophila pneumoniae: NOT DETECTED
INFLUENZA B-RVPPCR: NOT DETECTED
Influenza A: NOT DETECTED
MYCOPLASMA PNEUMONIAE-RVPPCR: NOT DETECTED
Metapneumovirus: NOT DETECTED
PARAINFLUENZA VIRUS 1-RVPPCR: NOT DETECTED
Parainfluenza Virus 2: NOT DETECTED
Parainfluenza Virus 3: NOT DETECTED
Parainfluenza Virus 4: NOT DETECTED
Respiratory Syncytial Virus: NOT DETECTED
Rhinovirus / Enterovirus: DETECTED — AB

## 2016-02-11 LAB — SODIUM, URINE, RANDOM: Sodium, Ur: 143 mmol/L

## 2016-02-11 LAB — STREP PNEUMONIAE URINARY ANTIGEN: STREP PNEUMO URINARY ANTIGEN: NEGATIVE

## 2016-02-11 LAB — INFLUENZA PANEL BY PCR (TYPE A & B)
Influenza A By PCR: NEGATIVE
Influenza B By PCR: NEGATIVE

## 2016-02-11 LAB — CBC
HCT: 31.5 % — ABNORMAL LOW (ref 36.0–46.0)
Hemoglobin: 10.5 g/dL — ABNORMAL LOW (ref 12.0–15.0)
MCH: 30.6 pg (ref 26.0–34.0)
MCHC: 33.3 g/dL (ref 30.0–36.0)
MCV: 91.8 fL (ref 78.0–100.0)
PLATELETS: 246 10*3/uL (ref 150–400)
RBC: 3.43 MIL/uL — ABNORMAL LOW (ref 3.87–5.11)
RDW: 15.2 % (ref 11.5–15.5)
WBC: 12 10*3/uL — ABNORMAL HIGH (ref 4.0–10.5)

## 2016-02-11 LAB — MRSA PCR SCREENING: MRSA BY PCR: NEGATIVE

## 2016-02-11 LAB — APTT: APTT: 36 s (ref 24–36)

## 2016-02-11 LAB — OSMOLALITY: Osmolality: 282 mOsm/kg (ref 275–295)

## 2016-02-11 LAB — LACTIC ACID, PLASMA
Lactic Acid, Venous: 1.9 mmol/L (ref 0.5–1.9)
Lactic Acid, Venous: 1.3 mmol/L (ref 0.5–1.9)

## 2016-02-11 LAB — PROTIME-INR
INR: 1.23
Prothrombin Time: 15.6 seconds — ABNORMAL HIGH (ref 11.4–15.2)

## 2016-02-11 LAB — OSMOLALITY, URINE: Osmolality, Ur: 562 mOsm/kg (ref 300–900)

## 2016-02-11 LAB — TSH: TSH: 2.187 u[IU]/mL (ref 0.350–4.500)

## 2016-02-11 LAB — RAPID STREP SCREEN (MED CTR MEBANE ONLY): STREPTOCOCCUS, GROUP A SCREEN (DIRECT): NEGATIVE

## 2016-02-11 MED ORDER — IPRATROPIUM-ALBUTEROL 0.5-2.5 (3) MG/3ML IN SOLN
3.0000 mL | Freq: Three times a day (TID) | RESPIRATORY_TRACT | Status: DC
  Administered 2016-02-11 – 2016-02-13 (×4): 3 mL via RESPIRATORY_TRACT
  Filled 2016-02-11 (×4): qty 3

## 2016-02-11 MED ORDER — IPRATROPIUM-ALBUTEROL 0.5-2.5 (3) MG/3ML IN SOLN
3.0000 mL | Freq: Four times a day (QID) | RESPIRATORY_TRACT | Status: DC
  Administered 2016-02-11 (×2): 3 mL via RESPIRATORY_TRACT
  Filled 2016-02-11 (×2): qty 3

## 2016-02-11 MED ORDER — ALPRAZOLAM 0.25 MG PO TABS
0.1250 mg | ORAL_TABLET | Freq: Three times a day (TID) | ORAL | Status: DC | PRN
Start: 2016-02-11 — End: 2016-02-13
  Administered 2016-02-13: 0.125 mg via ORAL
  Filled 2016-02-11: qty 1

## 2016-02-11 MED ORDER — DM-GUAIFENESIN ER 30-600 MG PO TB12
1.0000 | ORAL_TABLET | Freq: Two times a day (BID) | ORAL | Status: DC
  Administered 2016-02-11 (×2): 1 via ORAL
  Filled 2016-02-11 (×5): qty 1

## 2016-02-11 MED ORDER — METHYLPREDNISOLONE SODIUM SUCC 125 MG IJ SOLR
60.0000 mg | Freq: Two times a day (BID) | INTRAMUSCULAR | Status: DC
  Administered 2016-02-11 – 2016-02-13 (×5): 60 mg via INTRAVENOUS
  Filled 2016-02-11 (×5): qty 2

## 2016-02-11 MED ORDER — VANCOMYCIN HCL IN DEXTROSE 750-5 MG/150ML-% IV SOLN
750.0000 mg | Freq: Two times a day (BID) | INTRAVENOUS | Status: DC
  Administered 2016-02-11: 750 mg via INTRAVENOUS
  Filled 2016-02-11 (×2): qty 150

## 2016-02-11 NOTE — Progress Notes (Addendum)
PROGRESS NOTE    Lindsay Fateleanor Sutton  ZOX:[id number] DOB: 05/21/1928 DOA: 02/10/2016 PCP: No primary care provider on file.   Brief Narrative: Lindsay Sutton is a 82 y.o. female with medical history significant of hypertension, GERD, depression, anxiety, PMR, who presents with sore throat, cough, fever, shortness of breath.  pt was found to have WBC 20.2, lactate is up to 2.64, urinalysis with small moderate leukocyte, sodium 130, Cre 1.14, temperature 101.8, tachycardia, tachypnea, oxygen saturation 89% on room air. Chest x-ray showed bronchitis change. Patient is admitted to stepdown as inpatient.   Assessment & Plan:   Principal Problem:   Acute respiratory failure with hypoxia (HCC) Active Problems:   Acute bronchitis   GERD (gastroesophageal reflux disease)   Hypertension   Depression with anxiety   AKI (acute kidney injury) (HCC)   Hyponatremia  Acute respiratory failure with hypoxia due to acute bronchitis and sepsis:  Pt is septic with leukocytosis, elevated lactate, fever, tachycardia and tachypnea. - continue with IV Vancomycin and Aztreonam.  -Solu-Medrol 60 mg twice a day -rapid strep negative  - schedule nebulizer.  - Urine S. pneumococcal antigen negative  -influenza negative  -NSL.  -follow culture. Blood culture one out of 2 positive for staph, but aureus negative.  -urine culture positive for klebsiella.  Respiratory  panel positive for rhinovirus, coronavirus   Depression and anxiety:  -Continue home medications: Xanax, Lexapro   HTN: -hold   benicar due to AKI and hyponatremia -continue bystolic -IV hydralazine when necessary  Mild AKI: Cre 1.14, Likely due to prerenal secondary to dehydration and continuation of ARB - Hold Benica  Hyponatremia: Most likely due to poor oral intake. - improved with fluids.  - hold Benica -TSH 2.1   DVT prophylaxis:  Lovenox Code Status: Full code.  Family Communication: care discussed with patient.    Disposition Plan: remain inpatient   Consultants:   none   Procedures: none   Antimicrobials: Vancomycin, aztreonam    Subjective: She is feeling better, still coughing. Breathing better.   Objective: Vitals:   02/11/16 0530 02/11/16 0600 02/11/16 0630 02/11/16 0746  BP: (!) 149/55 (!) 127/47 (!) 127/39 (!) 156/56  Pulse: 94 86 87 93  Resp: 19 17 18 17   Temp:    98.6 F (37 C)  TempSrc:    Oral  SpO2: 95% 93% 93% 97%  Weight:      Height:        Intake/Output Summary (Last 24 hours) at 02/11/16 0757 Last data filed at 02/11/16 0600  Gross per 24 hour  Intake          3483.33 ml  Output              203 ml  Net          3280.33 ml   Filed Weights   02/10/16 2230  Weight: 75.9 kg (167 lb 5.3 oz)    Examination:  General exam: Appears calm and comfortable  Respiratory system: Clear to auscultation. Respiratory effort normal. Cardiovascular system: S1 & S2 heard, RRR. No JVD, murmurs, rubs, gallops or clicks. No pedal edema. Gastrointestinal system: Abdomen is nondistended, soft and nontender. No organomegaly or masses felt. Normal bowel sounds heard. Central nervous system: Alert and oriented. No focal neurological deficits. Extremities: Symmetric 5 x 5 power. Skin: No rashes, lesions or ulcers Psychiatry: Judgement and insight appear normal. Mood & affect appropriate.     Data Reviewed: I have personally reviewed following labs and imaging studies  CBC:  Recent Labs Lab 02/10/16 1358  WBC 20.2*  NEUTROABS 13.5*  HGB 12.3  HCT 37.1  MCV 93.7  PLT 274   Basic Metabolic Panel:  Recent Labs Lab 02/10/16 1358  NA 130*  K 3.7  CL 94*  CO2 24  GLUCOSE 132*  BUN 18  CREATININE 1.14*  CALCIUM 9.2   GFR: Estimated Creatinine Clearance: 32.9 mL/min (by C-G formula based on SCr of 1.14 mg/dL (H)). Liver Function Tests:  Recent Labs Lab 02/10/16 1358  AST 23  ALT 20  ALKPHOS 45  BILITOT 0.9  PROT 7.1  ALBUMIN 3.8   No results for  input(s): LIPASE, AMYLASE in the last 168 hours. No results for input(s): AMMONIA in the last 168 hours. Coagulation Profile:  Recent Labs Lab 02/11/16 0004  INR 1.23   Cardiac Enzymes: No results for input(s): CKTOTAL, CKMB, CKMBINDEX, TROPONINI in the last 168 hours. BNP (last 3 results) No results for input(s): PROBNP in the last 8760 hours. HbA1C: No results for input(s): HGBA1C in the last 72 hours. CBG: No results for input(s): GLUCAP in the last 168 hours. Lipid Profile: No results for input(s): CHOL, HDL, LDLCALC, TRIG, CHOLHDL, LDLDIRECT in the last 72 hours. Thyroid Function Tests:  Recent Labs  02/11/16 0231  TSH 2.187   Anemia Panel: No results for input(s): VITAMINB12, FOLATE, FERRITIN, TIBC, IRON, RETICCTPCT in the last 72 hours. Sepsis Labs:  Recent Labs Lab 02/10/16 1647 02/10/16 1750 02/10/16 1946 02/11/16 0004 02/11/16 0231  PROCALCITON  --  0.22  --   --   --   LATICACIDVEN 2.52*  --  2.64* 1.9 1.3    Recent Results (from the past 240 hour(s))  Blood Culture (routine x 2)     Status: None (Preliminary result)   Collection Time: 02/10/16  1:50 PM  Result Value Ref Range Status   Specimen Description   Final    BLOOD RIGHT ANTECUBITAL Performed at [facility]    Special Requests BOTTLES DRAWN AEROBIC AND ANAEROBIC [facility]  Final   Culture PENDING  Incomplete   Report Status PENDING  Incomplete  Blood Culture (routine x 2)     Status: None (Preliminary result)   Collection Time: 02/10/16  2:03 PM  Result Value Ref Range Status   Specimen Description   Final    BLOOD LEFT FOREARM Performed at [facility]    Special Requests BOTTLES DRAWN AEROBIC AND ANAEROBIC [facility]  Final   Culture PENDING  Incomplete   Report Status PENDING  Incomplete  MRSA PCR Screening     Status: None   Collection Time: 02/10/16 10:51 PM  Result Value Ref Range Status   MRSA by PCR NEGATIVE NEGATIVE Final    Comment:        The GeneXpert MRSA  Assay (FDA approved for NASAL specimens only), is one component of a comprehensive MRSA colonization surveillance program. It is not intended to diagnose MRSA infection nor to guide or monitor treatment for MRSA infections.   Rapid strep screen (not at [facility])     Status: None   Collection Time: 02/11/16  4:37 AM  Result Value Ref Range Status   Streptococcus, Group A Screen (Direct) NEGATIVE NEGATIVE Final    Comment: (NOTE) A Rapid Antigen test may result negative if the antigen level in the sample is below the detection level of this test. The FDA has not cleared this test as a stand-alone test therefore the rapid antigen negative result has reflexed to  a Group A Strep culture.          Radiology Studies: Dg Chest 2 View  Result Date: 02/10/2016 CLINICAL DATA:  Sore throat, cough and chills for 5-6 days. EXAM: CHEST  2 VIEW COMPARISON:  09/20/2015 FINDINGS: The cardiac silhouette, mediastinal and hilar contours are within normal limits and stable. Chronic bronchitic type lung changes but no definite acute overlying pulmonary process. Stable mild eventration of both hemidiaphragms. The bony thorax is intact. IMPRESSION: Mild chronic bronchitic type lung changes but no definite acute overlying pulmonary process. Electronically Signed   By: Rudie MeyerP.  Gallerani M.D.   On: 02/10/2016 12:50        Scheduled Meds: . acidophilus  1 capsule Oral Daily  . aspirin  81 mg Oral Daily  . aztreonam  1 g Intravenous Q8H  . aztreonam      . calcium-vitamin D  1 tablet Oral BID WC  . cholecalciferol  1,000 Units Oral Daily  . dextromethorphan-guaiFENesin  1 tablet Oral BID  . enoxaparin (LOVENOX) injection  40 mg Subcutaneous Daily  . escitalopram  20 mg Oral Daily  . ipratropium  0.5 mg Nebulization Q4H  . methylPREDNISolone (SOLU-MEDROL) injection  60 mg Intravenous BID  . nebivolol  1.25 mg Oral Daily  . pantoprazole  40 mg Oral Daily  . vancomycin  750 mg Intravenous Q12H    Continuous Infusions: . sodium chloride 75 mL/hr at 02/11/16 0740     LOS: 1 day    Time spent: 35 minutes.     Alba Coryegalado, Ediel Unangst A, MD [facility] Pager [phone]  If 7PM-7AM, please contact night-coverage [email or web address] Password [email or web address] 02/11/2016, 7:57 AM

## 2016-02-11 NOTE — Progress Notes (Signed)
  PHARMACY - PHYSICIAN COMMUNICATION CRITICAL VALUE ALERT - BLOOD CULTURE IDENTIFICATION (BCID)  Results for orders placed or performed during the hospital encounter of 02/10/16  Blood Culture ID Panel (Reflexed) (Collected: 02/10/2016  1:50 PM)  Result Value Ref Range   Enterococcus species NOT DETECTED NOT DETECTED   Listeria monocytogenes NOT DETECTED NOT DETECTED   Staphylococcus species DETECTED (A) NOT DETECTED   Staphylococcus aureus NOT DETECTED NOT DETECTED   Methicillin resistance NOT DETECTED NOT DETECTED   Streptococcus species NOT DETECTED NOT DETECTED   Streptococcus agalactiae NOT DETECTED NOT DETECTED   Streptococcus pneumoniae NOT DETECTED NOT DETECTED   Streptococcus pyogenes NOT DETECTED NOT DETECTED   Acinetobacter baumannii NOT DETECTED NOT DETECTED   Enterobacteriaceae species NOT DETECTED NOT DETECTED   Enterobacter cloacae complex NOT DETECTED NOT DETECTED   Escherichia coli NOT DETECTED NOT DETECTED   Klebsiella oxytoca NOT DETECTED NOT DETECTED   Klebsiella pneumoniae NOT DETECTED NOT DETECTED   Proteus species NOT DETECTED NOT DETECTED   Serratia marcescens NOT DETECTED NOT DETECTED   Haemophilus influenzae NOT DETECTED NOT DETECTED   Neisseria meningitidis NOT DETECTED NOT DETECTED   Pseudomonas aeruginosa NOT DETECTED NOT DETECTED   Candida albicans NOT DETECTED NOT DETECTED   Candida glabrata NOT DETECTED NOT DETECTED   Candida krusei NOT DETECTED NOT DETECTED   Candida parapsilosis NOT DETECTED NOT DETECTED   Candida tropicalis NOT DETECTED NOT DETECTED    Name of physician (or Provider) Contacted: Dr. Sunnie Nielsenegalado  Changes to prescribed antibiotics required: 1/2 Blood cultures with likely MSSE. Patient also with concern for PNA - on Vancomycin + Azactam. MRSA PCR negative and RVP + Coronavirus + Rhinovirus/Enterovirus. Recommendation was made to discontinue Vancomycin at this time and was accepted.   Thank you for allowing pharmacy to be a part of this  patient's care.  Georgina PillionElizabeth Ted Leonhart, PharmD, BCPS Clinical Pharmacist Pager: [phone] 02/11/2016 4:21 PM

## 2016-02-12 LAB — BASIC METABOLIC PANEL
ANION GAP: 11 (ref 5–15)
BUN: 14 mg/dL (ref 6–20)
CALCIUM: 8.5 mg/dL — AB (ref 8.9–10.3)
CO2: 19 mmol/L — ABNORMAL LOW (ref 22–32)
Chloride: 101 mmol/L (ref 101–111)
Creatinine, Ser: 0.93 mg/dL (ref 0.44–1.00)
GFR, EST NON AFRICAN AMERICAN: 54 mL/min — AB (ref >60)
Glucose, Bld: 137 mg/dL — ABNORMAL HIGH (ref 65–99)
Potassium: 4.2 mmol/L (ref 3.5–5.1)
Sodium: 131 mmol/L — ABNORMAL LOW (ref 135–145)

## 2016-02-12 LAB — URINE CULTURE: Culture: >=100000 — AB

## 2016-02-12 LAB — CBC
HCT: 32.8 % — ABNORMAL LOW (ref 36.0–46.0)
Hemoglobin: 11.1 g/dL — ABNORMAL LOW (ref 12.0–15.0)
MCH: 30.7 pg (ref 26.0–34.0)
MCHC: 33.8 g/dL (ref 30.0–36.0)
MCV: 90.9 fL (ref 78.0–100.0)
Platelets: 262 10*3/uL (ref 150–400)
RBC: 3.61 MIL/uL — ABNORMAL LOW (ref 3.87–5.11)
RDW: 14.3 % (ref 11.5–15.5)
WBC: 13.6 10*3/uL — AB (ref 4.0–10.5)

## 2016-02-12 LAB — PROCALCITONIN: PROCALCITONIN: 0.26 ng/mL

## 2016-02-12 MED ORDER — IRBESARTAN 75 MG PO TABS
37.5000 mg | ORAL_TABLET | Freq: Every day | ORAL | Status: DC
  Administered 2016-02-12: 37.5 mg via ORAL
  Filled 2016-02-12: qty 0.5

## 2016-02-12 MED ORDER — HYPROMELLOSE (GONIOSCOPIC) 2.5 % OP SOLN
1.0000 [drp] | OPHTHALMIC | Status: DC | PRN
  Administered 2016-02-12: 1 [drp] via OPHTHALMIC
  Filled 2016-02-12 (×2): qty 15

## 2016-02-12 MED ORDER — OLMESARTAN MEDOXOMIL 40 MG PO TABS
40.0000 mg | ORAL_TABLET | Freq: Every day | ORAL | Status: DC
  Administered 2016-02-13: 40 mg via ORAL
  Filled 2016-02-12: qty 1

## 2016-02-12 NOTE — Progress Notes (Signed)
PROGRESS NOTE    Lindsay Sutton  ZOX:[id number] DOB: 1928/10/19 DOA: 02/10/2016 PCP: No primary care provider on file.   Brief Narrative: Lindsay Sutton is a 82 y.o. female with medical history significant of hypertension, GERD, depression, anxiety, PMR, who presents with sore throat, cough, fever, shortness of breath.  pt was found to have WBC 20.2, lactate is up to 2.64, urinalysis with small moderate leukocyte, sodium 130, Cre 1.14, temperature 101.8, tachycardia, tachypnea, oxygen saturation 89% on room air. Chest x-ray showed bronchitis change. Patient is admitted to stepdown as inpatient.  Presents with sepsis, bronchitis, secondary to viral illness, and UTI.   Assessment & Plan:   Principal Problem:   Acute respiratory failure with hypoxia (HCC) Active Problems:   Acute bronchitis   GERD (gastroesophageal reflux disease)   Hypertension   Depression with anxiety   AKI (acute kidney injury) (HCC)   Hyponatremia  Acute respiratory failure with hypoxia due to acute bronchitis and sepsis:  Pt is septic with leukocytosis, elevated lactate, fever, tachycardia and tachypnea. - continue with IV Aztreonam. Vancomycin discontinue.  -Solu-Medrol 60 mg twice a day. Will need taper. patient already on chronic prednisone.  -rapid strep negative  - schedule nebulizer.  - Urine S. pneumococcal antigen negative  -influenza negative  -NSL.  -follow culture. Blood culture one out of 2 positive for staph, but aureus negative. Vancomycin discontinue.  -urine culture positive for klebsiella.  Respiratory  panel positive for rhinovirus, coronavirus   Depression and anxiety:  -Continue home medications: Xanax, Lexapro   HTN: -resume benicar.  -continue bystolic -IV hydralazine when necessary  Mild AKI: Cre 1.14, Likely due to prerenal secondary to dehydration and continuation of ARB Stable.   Hyponatremia: Most likely due to poor oral intake. - improved with fluids.  -TSH  2.1   DVT prophylaxis:  Lovenox Code Status: Full code.  Family Communication: care discussed with patient.  Disposition Plan: transfer to telemetry    Consultants:   none   Procedures: none   Antimicrobials: Vancomycin, aztreonam    Subjective: She is feeling better, still coughing. Breathing better. Still wheezing.   Objective: Vitals:   02/12/16 0600 02/12/16 0800 02/12/16 0821 02/12/16 1206  BP: (!) 156/62 (!) 195/82  (!) 174/79  Pulse:  95  86  Resp: 15 (!) 22  17  Temp:    98.3 F (36.8 C)  TempSrc:    Oral  SpO2:  98% 98% 96%  Weight:      Height:        Intake/Output Summary (Last 24 hours) at 02/12/16 1523 Last data filed at 02/12/16 0800  Gross per 24 hour  Intake              580 ml  Output              475 ml  Net              105 ml   Filed Weights   02/10/16 2230  Weight: 75.9 kg (167 lb 5.3 oz)    Examination:  General exam: Appears calm and comfortable  Respiratory system: Clear to auscultation. Respiratory effort normal. Cardiovascular system: S1 & S2 heard, RRR. No JVD, murmurs, rubs, gallops or clicks. No pedal edema. Gastrointestinal system: Abdomen is nondistended, soft and nontender. No organomegaly or masses felt. Normal bowel sounds heard. Central nervous system: Alert and oriented. No focal neurological deficits. Extremities: Symmetric 5 x 5 power. Skin: No rashes, lesions or ulcers Psychiatry: Judgement and insight appear  normal. Mood & affect appropriate.     Data Reviewed: I have personally reviewed following labs and imaging studies  CBC:  Recent Labs Lab 02/10/16 1358 02/11/16 0814 02/12/16 0808  WBC 20.2* 12.0* 13.6*  NEUTROABS 13.5*  --   --   HGB 12.3 10.5* 11.1*  HCT 37.1 31.5* 32.8*  MCV 93.7 91.8 90.9  PLT 274 246 262   Basic Metabolic Panel:  Recent Labs Lab 02/10/16 1358 02/11/16 0814 02/12/16 0808  NA 130* 132* 131*  K 3.7 4.3 4.2  CL 94* 102 101  CO2 24 23 19*  GLUCOSE 132* 96 137*  BUN  18 15 14   CREATININE 1.14* 1.04* 0.93  CALCIUM 9.2 7.8* 8.5*   GFR: Estimated Creatinine Clearance: 40.3 mL/min (by C-G formula based on SCr of 0.93 mg/dL). Liver Function Tests:  Recent Labs Lab 02/10/16 1358  AST 23  ALT 20  ALKPHOS 45  BILITOT 0.9  PROT 7.1  ALBUMIN 3.8   No results for input(s): LIPASE, AMYLASE in the last 168 hours. No results for input(s): AMMONIA in the last 168 hours. Coagulation Profile:  Recent Labs Lab 02/11/16 0004  INR 1.23   Cardiac Enzymes: No results for input(s): CKTOTAL, CKMB, CKMBINDEX, TROPONINI in the last 168 hours. BNP (last 3 results) No results for input(s): PROBNP in the last 8760 hours. HbA1C: No results for input(s): HGBA1C in the last 72 hours. CBG: No results for input(s): GLUCAP in the last 168 hours. Lipid Profile: No results for input(s): CHOL, HDL, LDLCALC, TRIG, CHOLHDL, LDLDIRECT in the last 72 hours. Thyroid Function Tests:  Recent Labs  02/11/16 0231  TSH 2.187   Anemia Panel: No results for input(s): VITAMINB12, FOLATE, FERRITIN, TIBC, IRON, RETICCTPCT in the last 72 hours. Sepsis Labs:  Recent Labs Lab 02/10/16 1647 02/10/16 1750 02/10/16 1946 02/11/16 0004 02/11/16 0231 02/12/16 0319  PROCALCITON  --  0.22  --   --   --  0.26  LATICACIDVEN 2.52*  --  2.64* 1.9 1.3  --     Recent Results (from the past 240 hour(s))  Blood Culture (routine x 2)     Status: Abnormal (Preliminary result)   Collection Time: 02/10/16  1:50 PM  Result Value Ref Range Status   Specimen Description BLOOD RIGHT ANTECUBITAL  Final   Special Requests BOTTLES DRAWN AEROBIC AND ANAEROBIC 3CC EACH  Final   Culture  Setup Time   Final    GRAM POSITIVE COCCI IN CLUSTERS AEROBIC BOTTLE ONLY CRITICAL RESULT CALLED TO, READ BACK BY AND VERIFIED WITH: Veronda PrudeE. Martin Pharm.D. 16:20 02/11/16 ([email or web address])    Culture (A)  Final    STAPHYLOCOCCUS SPECIES (COAGULASE NEGATIVE) THE SIGNIFICANCE OF ISOLATING THIS ORGANISM FROM A SINGLE SET  OF BLOOD CULTURES WHEN MULTIPLE SETS ARE DRAWN IS UNCERTAIN. PLEASE NOTIFY THE MICROBIOLOGY DEPARTMENT WITHIN ONE WEEK IF SPECIATION AND SENSITIVITIES ARE REQUIRED. Performed [facility]    Report Status PENDING  Incomplete  Urine culture     Status: Abnormal   Collection Time: 02/10/16  1:50 PM  Result Value Ref Range Status   Specimen Description URINE, RANDOM  Final   Special Requests NONE  Final   Culture >=100,000 COLONIES/mL KLEBSIELLA PNEUMONIAE (A)  Final   Report Status 02/12/2016 FINAL  Final   Organism ID, Bacteria KLEBSIELLA PNEUMONIAE (A)  Final      Susceptibility   Klebsiella pneumoniae - MIC*    AMPICILLIN >=32 RESISTANT Resistant     CEFAZOLIN <=4 SENSITIVE Sensitive  CEFTRIAXONE <=1 SENSITIVE Sensitive     CIPROFLOXACIN <=0.25 SENSITIVE Sensitive     GENTAMICIN <=1 SENSITIVE Sensitive     IMIPENEM <=0.25 SENSITIVE Sensitive     NITROFURANTOIN 64 INTERMEDIATE Intermediate     TRIMETH/SULFA <=20 SENSITIVE Sensitive     AMPICILLIN/SULBACTAM 4 SENSITIVE Sensitive     PIP/TAZO <=4 SENSITIVE Sensitive     Extended ESBL NEGATIVE Sensitive     * >=100,000 COLONIES/mL KLEBSIELLA PNEUMONIAE  Blood Culture ID Panel (Reflexed)     Status: Abnormal   Collection Time: 02/10/16  1:50 PM  Result Value Ref Range Status   Enterococcus species NOT DETECTED NOT DETECTED Final   Listeria monocytogenes NOT DETECTED NOT DETECTED Final   Staphylococcus species DETECTED (A) NOT DETECTED Final    Comment: CRITICAL RESULT CALLED TO, READ BACK BY AND VERIFIED WITH: Veronda Prude.D. 16:15 02/11/16 ([email or web address])    Staphylococcus aureus NOT DETECTED NOT DETECTED Final   Methicillin resistance NOT DETECTED NOT DETECTED Final   Streptococcus species NOT DETECTED NOT DETECTED Final   Streptococcus agalactiae NOT DETECTED NOT DETECTED Final   Streptococcus pneumoniae NOT DETECTED NOT DETECTED Final   Streptococcus pyogenes NOT DETECTED NOT DETECTED Final   Acinetobacter baumannii  NOT DETECTED NOT DETECTED Final   Enterobacteriaceae species NOT DETECTED NOT DETECTED Final   Enterobacter cloacae complex NOT DETECTED NOT DETECTED Final   Escherichia coli NOT DETECTED NOT DETECTED Final   Klebsiella oxytoca NOT DETECTED NOT DETECTED Final   Klebsiella pneumoniae NOT DETECTED NOT DETECTED Final   Proteus species NOT DETECTED NOT DETECTED Final   Serratia marcescens NOT DETECTED NOT DETECTED Final   Haemophilus influenzae NOT DETECTED NOT DETECTED Final   Neisseria meningitidis NOT DETECTED NOT DETECTED Final   Pseudomonas aeruginosa NOT DETECTED NOT DETECTED Final   Candida albicans NOT DETECTED NOT DETECTED Final   Candida glabrata NOT DETECTED NOT DETECTED Final   Candida krusei NOT DETECTED NOT DETECTED Final   Candida parapsilosis NOT DETECTED NOT DETECTED Final   Candida tropicalis NOT DETECTED NOT DETECTED Final    Comment: Performed at [facility]  Blood Culture (routine x 2)     Status: None (Preliminary result)   Collection Time: 02/10/16  2:03 PM  Result Value Ref Range Status   Specimen Description BLOOD LEFT FOREARM  Final   Special Requests BOTTLES DRAWN AEROBIC AND ANAEROBIC 5CC EACH  Final   Culture   Final    NO GROWTH 2 DAYS Performed at [facility]    Report Status PENDING  Incomplete  Respiratory Panel by PCR     Status: Abnormal   Collection Time: 02/10/16  4:35 PM  Result Value Ref Range Status   Adenovirus NOT DETECTED NOT DETECTED Final   Coronavirus 229E NOT DETECTED NOT DETECTED Final   Coronavirus HKU1 NOT DETECTED NOT DETECTED Final   Coronavirus NL63 NOT DETECTED NOT DETECTED Final   Coronavirus OC43 DETECTED (A) NOT DETECTED Final   Metapneumovirus NOT DETECTED NOT DETECTED Final   Rhinovirus / Enterovirus DETECTED (A) NOT DETECTED Final   Influenza A NOT DETECTED NOT DETECTED Final   Influenza B NOT DETECTED NOT DETECTED Final   Parainfluenza Virus 1 NOT DETECTED NOT DETECTED Final   Parainfluenza Virus 2 NOT  DETECTED NOT DETECTED Final   Parainfluenza Virus 3 NOT DETECTED NOT DETECTED Final   Parainfluenza Virus 4 NOT DETECTED NOT DETECTED Final   Respiratory Syncytial Virus NOT DETECTED NOT DETECTED Final   Bordetella pertussis NOT DETECTED NOT DETECTED  Final   Chlamydophila pneumoniae NOT DETECTED NOT DETECTED Final   Mycoplasma pneumoniae NOT DETECTED NOT DETECTED Final    Comment: Performed at [facility]  MRSA PCR Screening     Status: None   Collection Time: 02/10/16 10:51 PM  Result Value Ref Range Status   MRSA by PCR NEGATIVE NEGATIVE Final    Comment:        The GeneXpert MRSA Assay (FDA approved for NASAL specimens only), is one component of a comprehensive MRSA colonization surveillance program. It is not intended to diagnose MRSA infection nor to guide or monitor treatment for MRSA infections.   Rapid strep screen (not at [facility])     Status: None   Collection Time: 02/11/16  4:37 AM  Result Value Ref Range Status   Streptococcus, Group A Screen (Direct) NEGATIVE NEGATIVE Final    Comment: (NOTE) A Rapid Antigen test may result negative if the antigen level in the sample is below the detection level of this test. The FDA has not cleared this test as a stand-alone test therefore the rapid antigen negative result has reflexed to a Group A Strep culture.   Culture, group A strep     Status: None (Preliminary result)   Collection Time: 02/11/16  4:37 AM  Result Value Ref Range Status   Specimen Description THROAT  Final   Special Requests NONE  Final   Culture CULTURE REINCUBATED FOR BETTER GROWTH  Final   Report Status PENDING  Incomplete         Radiology Studies: No results found.      Scheduled Meds: . acidophilus  1 capsule Oral Daily  . aspirin  81 mg Oral Daily  . aztreonam  1 g Intravenous Q8H  . calcium-vitamin D  1 tablet Oral BID WC  . cholecalciferol  1,000 Units Oral Daily  . dextromethorphan-guaiFENesin  1 tablet Oral BID  .  enoxaparin (LOVENOX) injection  40 mg Subcutaneous Daily  . escitalopram  20 mg Oral Daily  . ipratropium-albuterol  3 mL Nebulization TID  . methylPREDNISolone (SOLU-MEDROL) injection  60 mg Intravenous BID  . nebivolol  1.25 mg Oral Daily  . [START ON 02/13/2016] olmesartan  40 mg Oral Daily  . pantoprazole  40 mg Oral Daily   Continuous Infusions:    LOS: 2 days    Time spent: 35 minutes.     Alba Coryegalado, Jazen Spraggins A, MD [facility] Pager [phone]  If 7PM-7AM, please contact night-coverage [email or web address] Password [email or web address] 02/12/2016, 3:23 PM

## 2016-02-12 NOTE — Progress Notes (Signed)
Attempted report 

## 2016-02-13 DIAGNOSIS — I10 Essential (primary) hypertension: Secondary | ICD-10-CM

## 2016-02-13 DIAGNOSIS — N179 Acute kidney failure, unspecified: Secondary | ICD-10-CM

## 2016-02-13 DIAGNOSIS — F418 Other specified anxiety disorders: Secondary | ICD-10-CM

## 2016-02-13 DIAGNOSIS — J9601 Acute respiratory failure with hypoxia: Secondary | ICD-10-CM

## 2016-02-13 DIAGNOSIS — J207 Acute bronchitis due to echovirus: Secondary | ICD-10-CM

## 2016-02-13 DIAGNOSIS — K219 Gastro-esophageal reflux disease without esophagitis: Secondary | ICD-10-CM

## 2016-02-13 DIAGNOSIS — E871 Hypo-osmolality and hyponatremia: Secondary | ICD-10-CM

## 2016-02-13 LAB — BASIC METABOLIC PANEL
Anion gap: 9 (ref 5–15)
BUN: 20 mg/dL (ref 6–20)
CO2: 22 mmol/L (ref 22–32)
Calcium: 9 mg/dL (ref 8.9–10.3)
Chloride: 99 mmol/L — ABNORMAL LOW (ref 101–111)
Creatinine, Ser: 0.99 mg/dL (ref 0.44–1.00)
GFR calc Af Amer: 59 mL/min — ABNORMAL LOW (ref >60)
GFR, EST NON AFRICAN AMERICAN: 51 mL/min — AB (ref >60)
GLUCOSE: 133 mg/dL — AB (ref 65–99)
POTASSIUM: 4.1 mmol/L (ref 3.5–5.1)
Sodium: 130 mmol/L — ABNORMAL LOW (ref 135–145)

## 2016-02-13 LAB — CBC
HCT: 32.3 % — ABNORMAL LOW (ref 36.0–46.0)
Hemoglobin: 11.1 g/dL — ABNORMAL LOW (ref 12.0–15.0)
MCH: 31.3 pg (ref 26.0–34.0)
MCHC: 34.4 g/dL (ref 30.0–36.0)
MCV: 91 fL (ref 78.0–100.0)
Platelets: 274 10*3/uL (ref 150–400)
RBC: 3.55 MIL/uL — ABNORMAL LOW (ref 3.87–5.11)
RDW: 14.9 % (ref 11.5–15.5)
WBC: 16.6 10*3/uL — ABNORMAL HIGH (ref 4.0–10.5)

## 2016-02-13 LAB — CULTURE, BLOOD (ROUTINE X 2)

## 2016-02-13 LAB — CULTURE, GROUP A STREP (THRC)

## 2016-02-13 MED ORDER — PREDNISONE 50 MG PO TABS: ORAL_TABLET | ORAL | 0 refills | Status: AC

## 2016-02-13 MED ORDER — AZITHROMYCIN 250 MG PO TABS: ORAL_TABLET | ORAL | 0 refills | Status: AC

## 2016-02-13 MED ORDER — IPRATROPIUM-ALBUTEROL 0.5-2.5 (3) MG/3ML IN SOLN: 3.0000 mL | Freq: Two times a day (BID) | RESPIRATORY_TRACT | Status: DC

## 2016-02-13 MED ORDER — DM-GUAIFENESIN ER 30-600 MG PO TB12: 1.0000 | ORAL_TABLET | Freq: Two times a day (BID) | ORAL | 0 refills | Status: AC | PRN

## 2016-02-13 NOTE — Progress Notes (Signed)
Pharmacy Antibiotic Note  Lindsay Sutton is a 82 y.o. female admitted on 02/10/2016 with possible PNA.  Pharmacy has been consulted for  Aztreonam dosing. Today is antibiotic day #4 - patient is afebrile, wbc elevated at 16.6 (on steroids), and scr stable at 0.93 (CrCl ~40 ml/min).  Plan: Aztreonam 1gm IV q8h Will f/u micro data, renal function, and pt's clinical condition Consider de-escalation when appropriate   Height: 5' (152.4 cm) Weight: 167 lb 5.3 oz (75.9 kg) IBW/kg (Calculated) : 45.5  Temp (24hrs), Avg:98 F (36.7 C), Min:97.9 F (36.6 C), Max:98.1 F (36.7 C)   Recent Labs Lab 02/10/16 1356 02/10/16 1358 02/10/16 1647 02/10/16 1946 02/11/16 0004 02/11/16 0231 02/11/16 0814 02/12/16 0808 02/13/16 0714  WBC  --  20.2*  --   --   --   --  12.0* 13.6* 16.6*  CREATININE  --  1.14*  --   --   --   --  1.04* 0.93 0.99  LATICACIDVEN 1.51  --  2.52* 2.64* 1.9 1.3  --   --   --     Estimated Creatinine Clearance: 37.8 mL/min (by C-G formula based on SCr of 0.99 mg/dL).    Allergies  Allergen Reactions  . Floxin [Ofloxacin] Anaphylaxis  . Ceftin [Cefuroxime Axetil] Other (See Comments)    Unknown  . Ciprofloxacin Other (See Comments)    Unknown  . Cymbalta [Duloxetine Hcl] Other (See Comments)    Unknown  . Detrol [Tolterodine] Other (See Comments)    Unknown  . Fish Allergy Other (See Comments)    Unknown  . Levaquin [Levofloxacin] Other (See Comments)    Unknown  . Mobic [Meloxicam] Other (See Comments)    Unknown  . Penicillins Other (See Comments)    Unknown  . Percocet [Oxycodone-Acetaminophen] Other (See Comments)    Unknown  . Rocephin [Ceftriaxone] Other (See Comments)    Unknown  . Septra [Sulfamethoxazole-Trimethoprim] Other (See Comments)    Unknown  . Sulfa Antibiotics Other (See Comments)    Unknown  . Dilaudid [Hydromorphone] Itching  . Morphine And Related Rash  . Pantoprazole Other (See Comments)    Bothers stomach     Antimicrobials this admission: 12/26 Vanc >> 12/29 12/26 Aztreonam >>   Dose adjustments this admission: n/a  Microbiology results: 12/27 BCx x2: 1/2 MSSE - likely contaminant 12/27 UCx: Klebsiella (R amp, I Macrobid) 12/27 MRSA PCR: negative 12/27 Resp panel: (+) coronavirus, rhinovirus/enterovirus 12/28 Rapid strep: negative 12/28 Group A strep: pending  Thank you for allowing pharmacy to be a part of this patient's care.  Lindsay Sutton, PharmD PGY1 Pharmacy Resident [phone] (Pager) 02/13/2016 12:53 PM

## 2016-02-13 NOTE — Progress Notes (Signed)
[name] to be D/C'd Home per MD order.  Discussed with the patient and all questions fully answered.  VSS, Skin clean, dry and intact without evidence of skin break down, no evidence of skin tears noted. IV catheter discontinued intact. Site without signs and symptoms of complications. Dressing and pressure applied.  An After Visit Summary was printed and given to the patient. Patient received prescription.  D/c education completed with patient/family including follow up instructions, medication list, d/c activities limitations if indicated, with other d/c instructions as indicated by MD - patient able to verbalize understanding, all questions fully answered.   Patient instructed to return to ED, call 911, or call MD for any changes in condition.   Patient escorted via WC, and D/C home via private auto.  Lindsay Sutton 02/13/2016 4:20 PM

## 2016-02-13 NOTE — Discharge Summary (Signed)
Physician Discharge Summary  Lindsay Sutton ZOX:[id number] DOB: [birth year] DOA: 02/10/2016  PCP: Sid Falcon, MD  Admit date: 02/10/2016 Discharge date: 02/13/2016  Admitted From: Home  Disposition:  Home    Discharge Condition:  Stable   CODE STATUS:  Full code   Diet recommendation:heart Healthy Consultations:      Discharge Diagnoses:  Principal Problem:   Acute respiratory failure with hypoxia (HCC) Active Problems:   Acute bronchitis   GERD (gastroesophageal reflux disease)   Hypertension   Depression with anxiety   AKI (acute kidney injury) (HCC)   Hyponatremia    Subjective: Cough is improving. No shortness of breath at rest or with exertion. Has some upper airway congestion but is no longer coughing up mucus  Brief Summary: Lindsay Sutton a 82 y.o.femalewith medical history significant of hypertension, GERD, depression, anxiety, PMR, who presents with sore throat, cough, fever, shortness of breath. pt was found to have WBC 20.2, lactateis up to 2.64, urinalysis with small moderate leukocyte, sodium 130, Cre 1.14, temperature 101.8, tachycardia, tachypnea, oxygen saturation 89% on room air. Chest x-ray showed bronchitis change. Patient is admitted to stepdown as inpatient.  Hospital Course:  Acute respiratory failure with hypoxia due to acute bronchitis and sepsis: - leukocytosis, elevated lactate, fever, tachycardia and tachypnea -  cough with green sputum is now improving - she was started on IV Vancomycin andAztreonam- allergic to PCN, Cephalosporins, Flouroquinolones - Respiratory  panel positive for rhinovirus, coronavirus -influenza negative - can transition Azactam to Z pak to cover for bacterial superinfection -He has been on IV steroids which I have transitioned to a short prednisone taper-she can resume her usual dose of prednisone afterwards - rapid strep negative  - Urine S. pneumococcal antigen negative  - influenza negative     UTI -Chronically incontinent of urine - UA + forKlebsiella pneumonia-she has been asymptomatic and therefore no need to treat  Polymyalgia rheumatica -Currently on prednisone at 15 mg daily   Depression and anxiety: -Continue home medications: Xanax, Lexapro   HTN: - Continue home medications  Mild AKI: - Cre 1.14, Likely due to prerenal secondary to dehydration in setting of ARB use - Has resolved  Hyponatremia: - Mild-Most likely due to poor oral intake. - Encouraged fluids   Discharge Instructions  Discharge Instructions    Diet - low sodium heart healthy    Complete by:  As directed    Increase activity slowly    Complete by:  As directed      Allergies as of 02/13/2016      Reactions   Floxin [ofloxacin] Anaphylaxis   Ceftin [cefuroxime Axetil] Other (See Comments)   Unknown   Ciprofloxacin Other (See Comments)   Unknown   Cymbalta [duloxetine Hcl] Other (See Comments)   Unknown   Detrol [tolterodine] Other (See Comments)   Unknown   Fish Allergy Other (See Comments)   Unknown   Levaquin [levofloxacin] Other (See Comments)   Unknown   Mobic [meloxicam] Other (See Comments)   Unknown   Penicillins Other (See Comments)   Unknown   Percocet [oxycodone-acetaminophen] Other (See Comments)   Unknown   Rocephin [ceftriaxone] Other (See Comments)   Unknown   Septra [sulfamethoxazole-trimethoprim] Other (See Comments)   Unknown   Sulfa Antibiotics Other (See Comments)   Unknown   Dilaudid [hydromorphone] Itching   Morphine And Related Rash   Pantoprazole Other (See Comments)   Bothers stomach      Medication List    TAKE these medications  aspirin 81 MG chewable tablet Chew 81 mg by mouth every other day.   azithromycin 250 MG tablet Commonly known as:  ZITHROMAX Z-PAK Take as directed   BENTYL 20 MG tablet Generic drug:  dicyclomine Take 20 mg by mouth as needed for spasms.   BLINK TEARS OP Place 1 drop into both eyes as  needed (for dry eyes).   chlorpheniramine 2 MG/5ML syrup Commonly known as:  CHLOR-TRIMETON Take 4 mg by mouth as needed for allergies.   cholecalciferol 1000 units tablet Commonly known as:  VITAMIN D Take 1,000 Units by mouth daily.   dextromethorphan-guaiFENesin 30-600 MG 12hr tablet Commonly known as:  MUCINEX DM Take 1 tablet by mouth 2 (two) times daily as needed for cough.   diphenhydrAMINE 25 mg capsule Commonly known as:  BENADRYL Take 25 mg by mouth at bedtime as needed for sleep.   escitalopram 20 MG tablet Commonly known as:  LEXAPRO Take 20 mg by mouth daily.   METAMUCIL FIBER PO Take 1 each by mouth 2 (two) times daily.   NAPHCON OP Place 1-4 drops into both eyes as needed (for allergies).   nebivolol 2.5 MG tablet Commonly known as:  BYSTOLIC Take 1.25 mg by mouth daily.   olmesartan 40 MG tablet Commonly known as:  BENICAR Take 40 mg by mouth daily.   omeprazole 10 MG capsule Commonly known as:  PRILOSEC Take 10 mg by mouth at bedtime.   predniSONE 50 MG tablet Commonly known as:  DELTASONE 50 mg tomorrow, taper by 10 mg daily until down to 20 mg. Then stop and resume 15 mg. What changed:  medication strength  how much to take  how to take this  when to take this  additional instructions   PROBIOTIC DAILY PO Take 1 capsule by mouth daily.   STOOL SOFTENER PO Take 1 capsule by mouth 2 (two) times daily.   VIACTIV PO Take 1 each by mouth 2 (two) times daily.   XANAX 0.25 MG tablet Generic drug:  ALPRAZolam Take 0.125 mg by mouth 3 (three) times daily as needed (for anxiety).      Follow-up Information    Sid FalconKALISH, MICHAEL J, MD Follow up.   Specialty:  Family Medicine Why:  by the end of this week.  Contact information: [id number] [place] [id number] [phone]          Allergies  Allergen Reactions  . Floxin [Ofloxacin] Anaphylaxis  . Ceftin [Cefuroxime Axetil] Other (See Comments)    Unknown  .  Ciprofloxacin Other (See Comments)    Unknown  . Cymbalta [Duloxetine Hcl] Other (See Comments)    Unknown  . Detrol [Tolterodine] Other (See Comments)    Unknown  . Fish Allergy Other (See Comments)    Unknown  . Levaquin [Levofloxacin] Other (See Comments)    Unknown  . Mobic [Meloxicam] Other (See Comments)    Unknown  . Penicillins Other (See Comments)    Unknown  . Percocet [Oxycodone-Acetaminophen] Other (See Comments)    Unknown  . Rocephin [Ceftriaxone] Other (See Comments)    Unknown  . Septra [Sulfamethoxazole-Trimethoprim] Other (See Comments)    Unknown  . Sulfa Antibiotics Other (See Comments)    Unknown  . Dilaudid [Hydromorphone] Itching  . Morphine And Related Rash  . Pantoprazole Other (See Comments)    Bothers stomach     Procedures/Studies: Dg Chest 2 View  Result Date: 02/10/2016 CLINICAL DATA:  Sore throat, cough and chills for 5-6  days. EXAM: CHEST  2 VIEW COMPARISON:  09/20/2015 FINDINGS: The cardiac silhouette, mediastinal and hilar contours are within normal limits and stable. Chronic bronchitic type lung changes but no definite acute overlying pulmonary process. Stable mild eventration of both hemidiaphragms. The bony thorax is intact. IMPRESSION: Mild chronic bronchitic type lung changes but no definite acute overlying pulmonary process. Electronically Signed   By: Rudie Meyer M.D.   On: 02/10/2016 12:50       Discharge Exam: Vitals:   02/13/16 0644 02/13/16 0645  BP: (!) 178/78 (!) 171/83  Pulse: 83   Resp:    Temp: 98 F (36.7 C)    Vitals:   02/12/16 2159 02/13/16 0644 02/13/16 0645 02/13/16 0913  BP:  (!) 178/78 (!) 171/83   Pulse:  83    Resp:      Temp:  98 F (36.7 C)    TempSrc:  Oral    SpO2: 98%   98%  Weight:      Height:        General: Pt is alert, awake, not in acute distress Cardiovascular: RRR, S1/S2 +, no rubs, no gallops Respiratory: CTA bilaterally, no wheezing, no rhonchi Abdominal: Soft, NT, ND, bowel  sounds + Extremities: no edema, no cyanosis    The results of significant diagnostics from this hospitalization (including imaging, microbiology, ancillary and laboratory) are listed below for reference.     Microbiology: Recent Results (from the past 240 hour(s))  Blood Culture (routine x 2)     Status: Abnormal   Collection Time: 02/10/16  1:50 PM  Result Value Ref Range Status   Specimen Description BLOOD RIGHT ANTECUBITAL  Final   Special Requests BOTTLES DRAWN AEROBIC AND ANAEROBIC 3CC EACH  Final   Culture  Setup Time   Final    GRAM POSITIVE COCCI IN CLUSTERS AEROBIC BOTTLE ONLY CRITICAL RESULT CALLED TO, READ BACK BY AND VERIFIED WITH: Veronda Prude.D. 16:20 02/11/16 ([email or web address])    Culture (A)  Final    STAPHYLOCOCCUS SPECIES (COAGULASE NEGATIVE) THE SIGNIFICANCE OF ISOLATING THIS ORGANISM FROM A SINGLE SET OF BLOOD CULTURES WHEN MULTIPLE SETS ARE DRAWN IS UNCERTAIN. PLEASE NOTIFY THE MICROBIOLOGY DEPARTMENT WITHIN ONE WEEK IF SPECIATION AND SENSITIVITIES ARE REQUIRED. Performed at [facility]    Report Status 02/13/2016 FINAL  Final  Urine culture     Status: Abnormal   Collection Time: 02/10/16  1:50 PM  Result Value Ref Range Status   Specimen Description URINE, RANDOM  Final   Special Requests NONE  Final   Culture >=100,000 COLONIES/mL KLEBSIELLA PNEUMONIAE (A)  Final   Report Status 02/12/2016 FINAL  Final   Organism ID, Bacteria KLEBSIELLA PNEUMONIAE (A)  Final      Susceptibility   Klebsiella pneumoniae - MIC*    AMPICILLIN >=32 RESISTANT Resistant     CEFAZOLIN <=4 SENSITIVE Sensitive     CEFTRIAXONE <=1 SENSITIVE Sensitive     CIPROFLOXACIN <=0.25 SENSITIVE Sensitive     GENTAMICIN <=1 SENSITIVE Sensitive     IMIPENEM <=0.25 SENSITIVE Sensitive     NITROFURANTOIN 64 INTERMEDIATE Intermediate     TRIMETH/SULFA <=20 SENSITIVE Sensitive     AMPICILLIN/SULBACTAM 4 SENSITIVE Sensitive     PIP/TAZO <=4 SENSITIVE Sensitive     Extended ESBL NEGATIVE  Sensitive     * >=100,000 COLONIES/mL KLEBSIELLA PNEUMONIAE  Blood Culture ID Panel (Reflexed)     Status: Abnormal   Collection Time: 02/10/16  1:50 PM  Result Value Ref Range Status   Enterococcus  species NOT DETECTED NOT DETECTED Final   Listeria monocytogenes NOT DETECTED NOT DETECTED Final   Staphylococcus species DETECTED (A) NOT DETECTED Final    Comment: CRITICAL RESULT CALLED TO, READ BACK BY AND VERIFIED WITH: EBrain Hilts.D. 16:15 02/11/16 ([email or web address])    Staphylococcus aureus NOT DETECTED NOT DETECTED Final   Methicillin resistance NOT DETECTED NOT DETECTED Final   Streptococcus species NOT DETECTED NOT DETECTED Final   Streptococcus agalactiae NOT DETECTED NOT DETECTED Final   Streptococcus pneumoniae NOT DETECTED NOT DETECTED Final   Streptococcus pyogenes NOT DETECTED NOT DETECTED Final   Acinetobacter baumannii NOT DETECTED NOT DETECTED Final   Enterobacteriaceae species NOT DETECTED NOT DETECTED Final   Enterobacter cloacae complex NOT DETECTED NOT DETECTED Final   Escherichia coli NOT DETECTED NOT DETECTED Final   Klebsiella oxytoca NOT DETECTED NOT DETECTED Final   Klebsiella pneumoniae NOT DETECTED NOT DETECTED Final   Proteus species NOT DETECTED NOT DETECTED Final   Serratia marcescens NOT DETECTED NOT DETECTED Final   Haemophilus influenzae NOT DETECTED NOT DETECTED Final   Neisseria meningitidis NOT DETECTED NOT DETECTED Final   Pseudomonas aeruginosa NOT DETECTED NOT DETECTED Final   Candida albicans NOT DETECTED NOT DETECTED Final   Candida glabrata NOT DETECTED NOT DETECTED Final   Candida krusei NOT DETECTED NOT DETECTED Final   Candida parapsilosis NOT DETECTED NOT DETECTED Final   Candida tropicalis NOT DETECTED NOT DETECTED Final    Comment: Performed at [facility]  Blood Culture (routine x 2)     Status: None (Preliminary result)   Collection Time: 02/10/16  2:03 PM  Result Value Ref Range Status   Specimen Description BLOOD LEFT FOREARM   Final   Special Requests BOTTLES DRAWN AEROBIC AND ANAEROBIC 5CC EACH  Final   Culture   Final    NO GROWTH 3 DAYS Performed at [facility]    Report Status PENDING  Incomplete  Respiratory Panel by PCR     Status: Abnormal   Collection Time: 02/10/16  4:35 PM  Result Value Ref Range Status   Adenovirus NOT DETECTED NOT DETECTED Final   Coronavirus 229E NOT DETECTED NOT DETECTED Final   Coronavirus HKU1 NOT DETECTED NOT DETECTED Final   Coronavirus NL63 NOT DETECTED NOT DETECTED Final   Coronavirus OC43 DETECTED (A) NOT DETECTED Final   Metapneumovirus NOT DETECTED NOT DETECTED Final   Rhinovirus / Enterovirus DETECTED (A) NOT DETECTED Final   Influenza A NOT DETECTED NOT DETECTED Final   Influenza B NOT DETECTED NOT DETECTED Final   Parainfluenza Virus 1 NOT DETECTED NOT DETECTED Final   Parainfluenza Virus 2 NOT DETECTED NOT DETECTED Final   Parainfluenza Virus 3 NOT DETECTED NOT DETECTED Final   Parainfluenza Virus 4 NOT DETECTED NOT DETECTED Final   Respiratory Syncytial Virus NOT DETECTED NOT DETECTED Final   Bordetella pertussis NOT DETECTED NOT DETECTED Final   Chlamydophila pneumoniae NOT DETECTED NOT DETECTED Final   Mycoplasma pneumoniae NOT DETECTED NOT DETECTED Final    Comment: Performed at [facility]  MRSA PCR Screening     Status: None   Collection Time: 02/10/16 10:51 PM  Result Value Ref Range Status   MRSA by PCR NEGATIVE NEGATIVE Final    Comment:        The GeneXpert MRSA Assay (FDA approved for NASAL specimens only), is one component of a comprehensive MRSA colonization surveillance program. It is not intended to diagnose MRSA infection nor to guide or monitor treatment for MRSA infections.  Rapid strep screen (not at [facility])     Status: None   Collection Time: 02/11/16  4:37 AM  Result Value Ref Range Status   Streptococcus, Group A Screen (Direct) NEGATIVE NEGATIVE Final    Comment: (NOTE) A Rapid Antigen test may result negative  if the antigen level in the sample is below the detection level of this test. The FDA has not cleared this test as a stand-alone test therefore the rapid antigen negative result has reflexed to a Group A Strep culture.   Culture, group A strep     Status: None (Preliminary result)   Collection Time: 02/11/16  4:37 AM  Result Value Ref Range Status   Specimen Description THROAT  Final   Special Requests NONE  Final   Culture CULTURE REINCUBATED FOR BETTER GROWTH  Final   Report Status PENDING  Incomplete     Labs: BNP (last 3 results) No results for input(s): BNP in the last 8760 hours. Basic Metabolic Panel:  Recent Labs Lab 02/10/16 1358 02/11/16 0814 02/12/16 0808 02/13/16 0714  NA 130* 132* 131* 130*  K 3.7 4.3 4.2 4.1  CL 94* 102 101 99*  CO2 24 23 19* 22  GLUCOSE 132* 96 137* 133*  BUN 18 15 14 20   CREATININE 1.14* 1.04* 0.93 0.99  CALCIUM 9.2 7.8* 8.5* 9.0   Liver Function Tests:  Recent Labs Lab 02/10/16 1358  AST 23  ALT 20  ALKPHOS 45  BILITOT 0.9  PROT 7.1  ALBUMIN 3.8   No results for input(s): LIPASE, AMYLASE in the last 168 hours. No results for input(s): AMMONIA in the last 168 hours. CBC:  Recent Labs Lab 02/10/16 1358 02/11/16 0814 02/12/16 0808 02/13/16 0714  WBC 20.2* 12.0* 13.6* 16.6*  NEUTROABS 13.5*  --   --   --   HGB 12.3 10.5* 11.1* 11.1*  HCT 37.1 31.5* 32.8* 32.3*  MCV 93.7 91.8 90.9 91.0  PLT 274 246 262 274   Cardiac Enzymes: No results for input(s): CKTOTAL, CKMB, CKMBINDEX, TROPONINI in the last 168 hours. BNP: Invalid input(s): POCBNP CBG: No results for input(s): GLUCAP in the last 168 hours. D-Dimer No results for input(s): DDIMER in the last 72 hours. Hgb A1c No results for input(s): HGBA1C in the last 72 hours. Lipid Profile No results for input(s): CHOL, HDL, LDLCALC, TRIG, CHOLHDL, LDLDIRECT in the last 72 hours. Thyroid function studies  Recent Labs  02/11/16 0231  TSH 2.187   Anemia work up No  results for input(s): VITAMINB12, FOLATE, FERRITIN, TIBC, IRON, RETICCTPCT in the last 72 hours. Urinalysis    Component Value Date/Time   COLORURINE AMBER (A) 02/10/2016 1350   APPEARANCEUR CLOUDY (A) 02/10/2016 1350   LABSPEC 1.024 02/10/2016 1350   PHURINE 5.5 02/10/2016 1350   GLUCOSEU NEGATIVE 02/10/2016 1350   HGBUR NEGATIVE 02/10/2016 1350   BILIRUBINUR SMALL (A) 02/10/2016 1350   KETONESUR 15 (A) 02/10/2016 1350   PROTEINUR NEGATIVE 02/10/2016 1350   NITRITE NEGATIVE 02/10/2016 1350   LEUKOCYTESUR SMALL (A) 02/10/2016 1350   Sepsis Labs Invalid input(s): PROCALCITONIN,  WBC,  LACTICIDVEN Microbiology Recent Results (from the past 240 hour(s))  Blood Culture (routine x 2)     Status: Abnormal   Collection Time: 02/10/16  1:50 PM  Result Value Ref Range Status   Specimen Description BLOOD RIGHT ANTECUBITAL  Final   Special Requests BOTTLES DRAWN AEROBIC AND ANAEROBIC [facility] EACH  Final   Culture  Setup Time   Final    GRAM  POSITIVE COCCI IN CLUSTERS AEROBIC BOTTLE ONLY CRITICAL RESULT CALLED TO, READ BACK BY AND VERIFIED WITH: EBrain Hilts.D. 16:20 02/11/16 ([email or web address])    Culture (A)  Final    STAPHYLOCOCCUS SPECIES (COAGULASE NEGATIVE) THE SIGNIFICANCE OF ISOLATING THIS ORGANISM FROM A SINGLE SET OF BLOOD CULTURES WHEN MULTIPLE SETS ARE DRAWN IS UNCERTAIN. PLEASE NOTIFY THE MICROBIOLOGY DEPARTMENT WITHIN ONE WEEK IF SPECIATION AND SENSITIVITIES ARE REQUIRED. Performed at [facility]    Report Status 02/13/2016 FINAL  Final  Urine culture     Status: Abnormal   Collection Time: 02/10/16  1:50 PM  Result Value Ref Range Status   Specimen Description URINE, RANDOM  Final   Special Requests NONE  Final   Culture >=100,000 COLONIES/mL KLEBSIELLA PNEUMONIAE (A)  Final   Report Status 02/12/2016 FINAL  Final   Organism ID, Bacteria KLEBSIELLA PNEUMONIAE (A)  Final      Susceptibility   Klebsiella pneumoniae - MIC*    AMPICILLIN >=32 RESISTANT Resistant      CEFAZOLIN <=4 SENSITIVE Sensitive     CEFTRIAXONE <=1 SENSITIVE Sensitive     CIPROFLOXACIN <=0.25 SENSITIVE Sensitive     GENTAMICIN <=1 SENSITIVE Sensitive     IMIPENEM <=0.25 SENSITIVE Sensitive     NITROFURANTOIN 64 INTERMEDIATE Intermediate     TRIMETH/SULFA <=20 SENSITIVE Sensitive     AMPICILLIN/SULBACTAM 4 SENSITIVE Sensitive     PIP/TAZO <=4 SENSITIVE Sensitive     Extended ESBL NEGATIVE Sensitive     * >=100,000 COLONIES/mL KLEBSIELLA PNEUMONIAE  Blood Culture ID Panel (Reflexed)     Status: Abnormal   Collection Time: 02/10/16  1:50 PM  Result Value Ref Range Status   Enterococcus species NOT DETECTED NOT DETECTED Final   Listeria monocytogenes NOT DETECTED NOT DETECTED Final   Staphylococcus species DETECTED (A) NOT DETECTED Final    Comment: CRITICAL RESULT CALLED TO, READ BACK BY AND VERIFIED WITH: Veronda Prude.D. 16:15 02/11/16 ([email or web address])    Staphylococcus aureus NOT DETECTED NOT DETECTED Final   Methicillin resistance NOT DETECTED NOT DETECTED Final   Streptococcus species NOT DETECTED NOT DETECTED Final   Streptococcus agalactiae NOT DETECTED NOT DETECTED Final   Streptococcus pneumoniae NOT DETECTED NOT DETECTED Final   Streptococcus pyogenes NOT DETECTED NOT DETECTED Final   Acinetobacter baumannii NOT DETECTED NOT DETECTED Final   Enterobacteriaceae species NOT DETECTED NOT DETECTED Final   Enterobacter cloacae complex NOT DETECTED NOT DETECTED Final   Escherichia coli NOT DETECTED NOT DETECTED Final   Klebsiella oxytoca NOT DETECTED NOT DETECTED Final   Klebsiella pneumoniae NOT DETECTED NOT DETECTED Final   Proteus species NOT DETECTED NOT DETECTED Final   Serratia marcescens NOT DETECTED NOT DETECTED Final   Haemophilus influenzae NOT DETECTED NOT DETECTED Final   Neisseria meningitidis NOT DETECTED NOT DETECTED Final   Pseudomonas aeruginosa NOT DETECTED NOT DETECTED Final   Candida albicans NOT DETECTED NOT DETECTED Final   Candida glabrata NOT  DETECTED NOT DETECTED Final   Candida krusei NOT DETECTED NOT DETECTED Final   Candida parapsilosis NOT DETECTED NOT DETECTED Final   Candida tropicalis NOT DETECTED NOT DETECTED Final    Comment: Performed at [facility]  Blood Culture (routine x 2)     Status: None (Preliminary result)   Collection Time: 02/10/16  2:03 PM  Result Value Ref Range Status   Specimen Description BLOOD LEFT FOREARM  Final   Special Requests BOTTLES DRAWN AEROBIC AND ANAEROBIC [facility] EACH  Final   Culture   Final  NO GROWTH 3 DAYS Performed at [facility]    Report Status PENDING  Incomplete  Respiratory Panel by PCR     Status: Abnormal   Collection Time: 02/10/16  4:35 PM  Result Value Ref Range Status   Adenovirus NOT DETECTED NOT DETECTED Final   Coronavirus 229E NOT DETECTED NOT DETECTED Final   Coronavirus HKU1 NOT DETECTED NOT DETECTED Final   Coronavirus NL63 NOT DETECTED NOT DETECTED Final   Coronavirus OC43 DETECTED (A) NOT DETECTED Final   Metapneumovirus NOT DETECTED NOT DETECTED Final   Rhinovirus / Enterovirus DETECTED (A) NOT DETECTED Final   Influenza A NOT DETECTED NOT DETECTED Final   Influenza B NOT DETECTED NOT DETECTED Final   Parainfluenza Virus 1 NOT DETECTED NOT DETECTED Final   Parainfluenza Virus 2 NOT DETECTED NOT DETECTED Final   Parainfluenza Virus 3 NOT DETECTED NOT DETECTED Final   Parainfluenza Virus 4 NOT DETECTED NOT DETECTED Final   Respiratory Syncytial Virus NOT DETECTED NOT DETECTED Final   Bordetella pertussis NOT DETECTED NOT DETECTED Final   Chlamydophila pneumoniae NOT DETECTED NOT DETECTED Final   Mycoplasma pneumoniae NOT DETECTED NOT DETECTED Final    Comment: Performed at [facility]  MRSA PCR Screening     Status: None   Collection Time: 02/10/16 10:51 PM  Result Value Ref Range Status   MRSA by PCR NEGATIVE NEGATIVE Final    Comment:        The GeneXpert MRSA Assay (FDA approved for NASAL specimens only), is one component of  a comprehensive MRSA colonization surveillance program. It is not intended to diagnose MRSA infection nor to guide or monitor treatment for MRSA infections.   Rapid strep screen (not at [facility])     Status: None   Collection Time: 02/11/16  4:37 AM  Result Value Ref Range Status   Streptococcus, Group A Screen (Direct) NEGATIVE NEGATIVE Final    Comment: (NOTE) A Rapid Antigen test may result negative if the antigen level in the sample is below the detection level of this test. The FDA has not cleared this test as a stand-alone test therefore the rapid antigen negative result has reflexed to a Group A Strep culture.   Culture, group A strep     Status: None (Preliminary result)   Collection Time: 02/11/16  4:37 AM  Result Value Ref Range Status   Specimen Description THROAT  Final   Special Requests NONE  Final   Culture CULTURE REINCUBATED FOR BETTER GROWTH  Final   Report Status PENDING  Incomplete     Time coordinating discharge: Over 30 minutes  SIGNED:   Calvert CantorIZWAN,Smaran Gaus, MD  [facility] 02/13/2016, 12:56 PM Pager   If 7PM-7AM, please contact night-coverage [email or web address] Password [email or web address]

## 2016-02-13 NOTE — Discharge Instructions (Signed)

## 2016-02-15 LAB — CULTURE, BLOOD (ROUTINE X 2): Culture: NO GROWTH

## 2016-05-27 ENCOUNTER — Emergency Department (HOSPITAL_BASED_OUTPATIENT_CLINIC_OR_DEPARTMENT_OTHER)
Admission: EM | Admit: 2016-05-27 | Discharge: 2016-05-27 | Disposition: A | Discharge status: Home / Self Care | Payer: Medicare Other | Attending: Emergency Medicine | Admitting: Emergency Medicine

## 2016-05-27 ENCOUNTER — Encounter (HOSPITAL_BASED_OUTPATIENT_CLINIC_OR_DEPARTMENT_OTHER): Payer: Self-pay | Admitting: *Deleted

## 2016-05-27 DIAGNOSIS — Z7982 Long term (current) use of aspirin: Secondary | ICD-10-CM | POA: Insufficient documentation

## 2016-05-27 DIAGNOSIS — I10 Essential (primary) hypertension: Secondary | ICD-10-CM | POA: Insufficient documentation

## 2016-05-27 DIAGNOSIS — M542 Cervicalgia: Secondary | ICD-10-CM | POA: Insufficient documentation

## 2016-05-27 DIAGNOSIS — N289 Disorder of kidney and ureter, unspecified: Secondary | ICD-10-CM | POA: Diagnosis not present

## 2016-05-27 DIAGNOSIS — R040 Epistaxis: Secondary | ICD-10-CM | POA: Insufficient documentation

## 2016-05-27 LAB — CBC WITH DIFFERENTIAL/PLATELET
BASOS PCT: 0 %
Basophils Absolute: 0 10*3/uL (ref 0.0–0.1)
EOS PCT: 0 %
Eosinophils Absolute: 0 10*3/uL (ref 0.0–0.7)
HCT: 35.5 % — ABNORMAL LOW (ref 36.0–46.0)
HEMOGLOBIN: 11.9 g/dL — AB (ref 12.0–15.0)
Lymphocytes Relative: 24 %
Lymphs Abs: 2.5 10*3/uL (ref 0.7–4.0)
MCH: 31.3 pg (ref 26.0–34.0)
MCHC: 33.5 g/dL (ref 30.0–36.0)
MCV: 93.4 fL (ref 78.0–100.0)
MONO ABS: 0.8 10*3/uL (ref 0.1–1.0)
Monocytes Relative: 8 %
NEUTROS PCT: 68 %
Neutro Abs: 7.3 10*3/uL (ref 1.7–7.7)
PLATELETS: 314 10*3/uL (ref 150–400)
RBC: 3.8 MIL/uL — ABNORMAL LOW (ref 3.87–5.11)
RDW: 13.1 % (ref 11.5–15.5)
WBC: 10.6 10*3/uL — ABNORMAL HIGH (ref 4.0–10.5)

## 2016-05-27 LAB — BASIC METABOLIC PANEL
Anion gap: 10 (ref 5–15)
BUN: 22 mg/dL — AB (ref 6–20)
CHLORIDE: 99 mmol/L — AB (ref 101–111)
CO2: 26 mmol/L (ref 22–32)
CREATININE: 1.29 mg/dL — AB (ref 0.44–1.00)
Calcium: 9.1 mg/dL (ref 8.9–10.3)
GFR calc non Af Amer: 37 mL/min — ABNORMAL LOW (ref >60)
GFR, EST AFRICAN AMERICAN: 43 mL/min — AB (ref >60)
GLUCOSE: 200 mg/dL — AB (ref 65–99)
Potassium: 3.7 mmol/L (ref 3.5–5.1)
Sodium: 135 mmol/L (ref 135–145)

## 2016-05-27 NOTE — ED Triage Notes (Addendum)
Pt c/o increased bp and h/a x 1 week also c/o nose bleeds

## 2016-05-27 NOTE — ED Provider Notes (Signed)
[facility] Provider Note   CSN: [id number] Arrival date & time: 05/27/16  1853  By signing my name below, I, Nelwyn Salisbury, attest that this documentation has been prepared under the direction and in the presence of Rolan Bucco, MD. Electronically Signed: Nelwyn Salisbury, Scribe. 05/27/2016. 7:44 PM.  History   Chief Complaint Chief Complaint  Patient presents with  . Hypertension   The history is provided by the patient. No language interpreter was used.    HPI Comments:  Lindsay Sutton is a 83 y.o. female with pmhx of HTN and GERD who presents to the Emergency Department complaining of an elevated BP. She has taken her BP at home and found her systolic pressure to be in the 200s. Pt states that she missed a dose of her BP medication two days ago and took a Zyrtec earlier today, but otherwise does not recall any other factors that would raise her blood pressure.  She reports an intermittent bifrontal-type headache. She reports some increased allergy symptoms. She's had the same symptoms in the past.  Denies any nausea, vomiting, diarrhea, CP, SOB or any other symptoms. Pt is compliant with a 15 mg/day dose of prednisone and states that her prednisone regimen has had negative impacts on her life, including increased fatigue and weakness.   Pt secondarily complains of intermittent nosebleeds onset >1 week. She states she was seen by an ENT for her nosebleeds and had her nose cauterized, she continued to bleed and had to go back to have it recauterized. She's had no bleeding since that time.  Past Medical History:  Diagnosis Date  . Anxiety   . Depression with anxiety   . GERD (gastroesophageal reflux disease)   . Hypertension   . Polymyalgia rheumatica [facility])     Patient Active Problem List   Diagnosis Date Noted  . Influenza-like illness 02/10/2016  . Acute respiratory failure with hypoxia (HCC) 02/10/2016  . Acute bronchitis 02/10/2016  . AKI (acute kidney injury)  (HCC) 02/10/2016  . Hyponatremia 02/10/2016  . Acute respiratory failure (HCC) 02/10/2016  . GERD (gastroesophageal reflux disease)   . Hypertension   . Depression with anxiety     Past Surgical History:  Procedure Laterality Date  . ABDOMINAL HYSTERECTOMY    . CHOLECYSTECTOMY      OB History    No data available       Home Medications    Prior to Admission medications   Medication Sig Start Date End Date Taking? Authorizing Provider  ALPRAZolam (XANAX) 0.25 MG tablet Take 0.125 mg by mouth 3 (three) times daily as needed (for anxiety).     Historical Provider, MD  aspirin 81 MG chewable tablet Chew 81 mg by mouth every other day.     Historical Provider, MD  azithromycin (ZITHROMAX Z-PAK) 250 MG tablet Take as directed 02/13/16   Calvert Cantor, MD  Calcium-Vitamin D-Vitamin K (VIACTIV PO) Take 1 each by mouth 2 (two) times daily.    Historical Provider, MD  chlorpheniramine (CHLOR-TRIMETON) 2 MG/5ML syrup Take 4 mg by mouth as needed for allergies.    Historical Provider, MD  cholecalciferol (VITAMIN D) 1000 units tablet Take 1,000 Units by mouth daily.    Historical Provider, MD  dextromethorphan-guaiFENesin (MUCINEX DM) 30-600 MG 12hr tablet Take 1 tablet by mouth 2 (two) times daily as needed for cough. 02/13/16   Calvert Cantor, MD  dicyclomine (BENTYL) 20 MG tablet Take 20 mg by mouth as needed for spasms.    Historical Provider,  MD  diphenhydrAMINE (BENADRYL) 25 mg capsule Take 25 mg by mouth at bedtime as needed for sleep.     Historical Provider, MD  Docusate Calcium (STOOL SOFTENER PO) Take 1 capsule by mouth 2 (two) times daily.     Historical Provider, MD  escitalopram (LEXAPRO) 20 MG tablet Take 20 mg by mouth daily.    Historical Provider, MD  Naphazoline HCl (NAPHCON OP) Place 1-4 drops into both eyes as needed (for allergies).     Historical Provider, MD  nebivolol (BYSTOLIC) 2.5 MG tablet Take 1.25 mg by mouth daily.    Historical Provider, MD  olmesartan (BENICAR)  40 MG tablet Take 40 mg by mouth daily.    Historical Provider, MD  omeprazole (PRILOSEC) 10 MG capsule Take 10 mg by mouth at bedtime.    Historical Provider, MD  Polyethylene Glycol 400 (BLINK TEARS OP) Place 1 drop into both eyes as needed (for dry eyes).    Historical Provider, MD  predniSONE (DELTASONE) 50 MG tablet 50 mg tomorrow, taper by 10 mg daily until down to 20 mg. Then stop and resume 15 mg. 02/13/16   Calvert Cantor, MD  Probiotic Product (PROBIOTIC DAILY PO) Take 1 capsule by mouth daily.     Historical Provider, MD  Psyllium (METAMUCIL FIBER PO) Take 1 each by mouth 2 (two) times daily.     Historical Provider, MD    Family History Family History  Problem Relation Age of Onset  . Heart attack Father   . Bladder Cancer Sister     Social History Social History  Substance Use Topics  . Smoking status: Never Smoker  . Smokeless tobacco: Never Used  . Alcohol use No     Allergies   Floxin [ofloxacin]; Ceftin [cefuroxime axetil]; Ciprofloxacin; Cymbalta [duloxetine hcl]; Detrol [tolterodine]; Fish allergy; Levaquin [levofloxacin]; Mobic [meloxicam]; Penicillins; Percocet [oxycodone-acetaminophen]; Rocephin [ceftriaxone]; Septra [sulfamethoxazole-trimethoprim]; Sulfa antibiotics; Dilaudid [hydromorphone]; Morphine and related; and Pantoprazole   Review of Systems Review of Systems  Constitutional: Negative for chills, diaphoresis, fatigue and fever.  HENT: Positive for nosebleeds. Negative for congestion, rhinorrhea and sneezing.   Eyes: Negative.   Respiratory: Negative for cough, chest tightness and shortness of breath.   Cardiovascular: Negative for chest pain and leg swelling.  Gastrointestinal: Negative for abdominal pain, blood in stool, diarrhea, nausea and vomiting.  Genitourinary: Negative for difficulty urinating, flank pain, frequency and hematuria.  Musculoskeletal: Positive for neck pain. Negative for arthralgias and back pain.  Skin: Negative for rash.    Neurological: Positive for dizziness and headaches. Negative for speech difficulty, weakness and numbness.     Physical Exam Updated Vital Signs BP (!) 187/74 (BP Location: Left Arm)   Pulse 88   Temp 98 F (36.7 C)   Resp 18   Ht 5' (1.524 m)   Wt 164 lb (74.4 kg)   LMP  (LMP Unknown)   SpO2 99%   BMI 32.03 kg/m   Physical Exam  Constitutional: She is oriented to person, place, and time. She appears well-developed and well-nourished.  HENT:  Head: Normocephalic and atraumatic.  Eyes: Pupils are equal, round, and reactive to light.  Neck: Normal range of motion. Neck supple.  Cardiovascular: Normal rate, regular rhythm and normal heart sounds.   Pulmonary/Chest: Effort normal and breath sounds normal. No respiratory distress. She has no wheezes. She has no rales. She exhibits no tenderness.  Abdominal: Soft. Bowel sounds are normal. There is no tenderness. There is no rebound and no guarding.  Musculoskeletal: Normal  range of motion. She exhibits no edema.  Lymphadenopathy:    She has no cervical adenopathy.  Neurological: She is alert and oriented to person, place, and time.  Motor 5/5 all extremities Sensation grossly intact to LT all extremities Finger to Nose intact, no pronator drift CN II-XII grossly intact Gait normal   Skin: Skin is warm and dry. No rash noted.  Psychiatric: She has a normal mood and affect.     ED Treatments / Results  DIAGNOSTIC STUDIES:  Oxygen Saturation is 99% on RA, normal by my interpretation.    COORDINATION OF CARE:  7:59 PM Discussed treatment plan with pt at bedside which includes CBC and BMP and pt agreed to plan.  During this time, patient also denied any EKG.   9:34 PM Updated patient on blood work. She feels she is ready to go home and will follow-up with her PCP tomorrow.    Labs (all labs ordered are listed, but only abnormal results are displayed) Labs Reviewed  BASIC METABOLIC PANEL - Abnormal; Notable for the  following:       Result Value   Chloride 99 (*)    Glucose, Bld 200 (*)    BUN 22 (*)    Creatinine, Ser 1.29 (*)    GFR calc non Af Amer 37 (*)    GFR calc Af Amer 43 (*)    All other components within normal limits  CBC WITH DIFFERENTIAL/PLATELET - Abnormal; Notable for the following:    WBC 10.6 (*)    RBC 3.80 (*)    Hemoglobin 11.9 (*)    HCT 35.5 (*)    All other components within normal limits    EKG  EKG Interpretation None       Radiology No results found.  Procedures Procedures (including critical care time)  Medications Ordered in ED Medications - No data to display   Initial Impression / Assessment and Plan / ED Course  I have reviewed the triage vital signs and the nursing notes.  Pertinent labs & imaging results that were available during my care of the patient were reviewed by me and considered in my medical decision making (see chart for details).     Patient presents with elevated blood pressure. Her blood pressure has improved without treatment in the ED. She doesn't have any symptoms associated with the hypertension. She has a mild elevation in her creatinine and a mild anemia which appears to be chronic. Her sugar is elevated but she states she just ate a waffle prior to coming to the emergency department. She will follow-up with her PCP. She also requested information about following up with a PCP here at [facility]. She was given this information. She was discharged home in good condition. Return precautions were given.  Final Clinical Impressions(s) / ED Diagnoses   Final diagnoses:  Essential hypertension  Renal insufficiency    New Prescriptions New Prescriptions   No medications on file  I personally performed the services described in this documentation, which was scribed in my presence.  The recorded information has been reviewed and considered.     Rolan Bucco, MD 05/27/16 2140

## 2016-08-18 ENCOUNTER — Encounter (HOSPITAL_BASED_OUTPATIENT_CLINIC_OR_DEPARTMENT_OTHER): Payer: Self-pay | Admitting: Emergency Medicine

## 2016-08-18 ENCOUNTER — Emergency Department (HOSPITAL_BASED_OUTPATIENT_CLINIC_OR_DEPARTMENT_OTHER)
Admission: EM | Admit: 2016-08-18 | Discharge: 2016-08-18 | Disposition: A | Discharge status: Home / Self Care | Payer: Medicare Other | Attending: Emergency Medicine | Admitting: Emergency Medicine

## 2016-08-18 DIAGNOSIS — I10 Essential (primary) hypertension: Secondary | ICD-10-CM | POA: Diagnosis present

## 2016-08-18 DIAGNOSIS — Z7982 Long term (current) use of aspirin: Secondary | ICD-10-CM | POA: Insufficient documentation

## 2016-08-18 DIAGNOSIS — Z79899 Other long term (current) drug therapy: Secondary | ICD-10-CM | POA: Insufficient documentation

## 2016-08-18 NOTE — ED Provider Notes (Signed)
[facility]   CSN: [id number] Arrival date & time: 08/18/16  1129     History   Chief Complaint Chief Complaint  Patient presents with  . Hypertension    HPI Lindsay Sutton is a 83 y.o. female.  HPI Patient presents to the emergency department concerned about elevated blood pressure.  She states her blood pressure was 200 systolic today.  She is asymptomatic.  She has been dealing with hypertension for some time and recently had her blood pressure medications increased by her physician.  She increased her by systolic to 2.5 mg twice a day.  This occurred approximately one week ago and her blood pressure continues to be elevated.  She states in the evenings her blood pressures in the 150s systolic.  She does admit to poor diet and lack of exercise.  She reports a high salt diet and understands that this is a bad combination.  No chest pain shortness breath.  No headache.   Past Medical History:  Diagnosis Date  . Anxiety   . Depression with anxiety   . GERD (gastroesophageal reflux disease)   . Hypertension   . Polymyalgia rheumatica [facility])     Patient Active Problem List   Diagnosis Date Noted  . Influenza-like illness 02/10/2016  . Acute respiratory failure with hypoxia (HCC) 02/10/2016  . Acute bronchitis 02/10/2016  . AKI (acute kidney injury) (HCC) 02/10/2016  . Hyponatremia 02/10/2016  . Acute respiratory failure (HCC) 02/10/2016  . GERD (gastroesophageal reflux disease)   . Hypertension   . Depression with anxiety     Past Surgical History:  Procedure Laterality Date  . ABDOMINAL HYSTERECTOMY    . CHOLECYSTECTOMY      OB History    No data available       Home Medications    Prior to Admission medications   Medication Sig Start Date End Date Taking? Authorizing Provider  ALPRAZolam (XANAX) 0.25 MG tablet Take 0.125 mg by mouth 3 (three) times daily as needed (for anxiety).     [provider]  aspirin 81 MG  chewable tablet Chew 81 mg by mouth every other day.     [provider]  azithromycin (ZITHROMAX Z-PAK) 250 MG tablet Take as directed 02/13/16   Calvert Cantor, MD  Calcium-Vitamin D-Vitamin K (VIACTIV PO) Take 1 each by mouth 2 (two) times daily.    [provider]  chlorpheniramine (CHLOR-TRIMETON) 2 MG/5ML syrup Take 4 mg by mouth as needed for allergies.    [provider]  cholecalciferol (VITAMIN D) 1000 units tablet Take 1,000 Units by mouth daily.    [provider]  dextromethorphan-guaiFENesin (MUCINEX DM) 30-600 MG 12hr tablet Take 1 tablet by mouth 2 (two) times daily as needed for cough. 02/13/16   Calvert Cantor, MD  dicyclomine (BENTYL) 20 MG tablet Take 20 mg by mouth as needed for spasms.    [provider]  diphenhydrAMINE (BENADRYL) 25 mg capsule Take 25 mg by mouth at bedtime as needed for sleep.     [provider]  Docusate Calcium (STOOL SOFTENER PO) Take 1 capsule by mouth 2 (two) times daily.     [provider]  escitalopram (LEXAPRO) 20 MG tablet Take 20 mg by mouth daily.    [provider]  Naphazoline HCl (NAPHCON OP) Place 1-4 drops into both eyes as needed (for allergies).     [provider]  nebivolol (BYSTOLIC) 2.5 MG tablet Take 1.25 mg by mouth daily.  [provider]  olmesartan (BENICAR) 40 MG tablet Take 40 mg by mouth daily.    [provider]  omeprazole (PRILOSEC) 10 MG capsule Take 10 mg by mouth at bedtime.    [provider]  Polyethylene Glycol 400 (BLINK TEARS OP) Place 1 drop into both eyes as needed (for dry eyes).    [provider]  predniSONE (DELTASONE) 50 MG tablet 50 mg tomorrow, taper by 10 mg daily until down to 20 mg. Then stop and resume 15 mg. 02/13/16   Calvert Cantor, MD  Probiotic Product (PROBIOTIC DAILY PO) Take 1 capsule by mouth daily.     [provider]  Psyllium (METAMUCIL FIBER PO) Take 1 each by mouth 2  (two) times daily.     [provider]    Family History Family History  Problem Relation Age of Onset  . Heart attack Father   . Bladder Cancer Sister     Social History Social History  Substance Use Topics  . Smoking status: Never Smoker  . Smokeless tobacco: Never Used  . Alcohol use No     Allergies   Floxin [ofloxacin]; Ceftin [cefuroxime axetil]; Ciprofloxacin; Cymbalta [duloxetine hcl]; Detrol [tolterodine]; Fish allergy; Levaquin [levofloxacin]; Mobic [meloxicam]; Penicillins; Percocet [oxycodone-acetaminophen]; Rocephin [ceftriaxone]; Septra [sulfamethoxazole-trimethoprim]; Sulfa antibiotics; Dilaudid [hydromorphone]; Morphine and related; and Pantoprazole   Review of Systems Review of Systems  All other systems reviewed and are negative.    Physical Exam Updated Vital Signs BP (!) 192/86   Pulse 71   Temp 98 F (36.7 C) (Oral)   Resp 18   Ht 5' (1.524 m)   Wt 74.4 kg (164 lb)   LMP  (LMP Unknown)   SpO2 100%   BMI 32.03 kg/m   Physical Exam  Constitutional: She is oriented to person, place, and time. She appears well-developed and well-nourished. No distress.  Obese  HENT:  Head: Normocephalic and atraumatic.  Eyes: EOM are normal.  Neck: Normal range of motion.  Cardiovascular: Normal rate, regular rhythm and normal heart sounds.   Pulmonary/Chest: Effort normal and breath sounds normal.  Abdominal: Soft. She exhibits no distension. There is no tenderness.  Musculoskeletal: Normal range of motion.  Neurological: She is alert and oriented to person, place, and time.  Skin: Skin is warm and dry.  Psychiatric: She has a normal mood and affect. Judgment normal.  Nursing note and vitals reviewed.    ED Treatments / Results  Labs (all labs ordered are listed, but only abnormal results are displayed) Labs Reviewed - No data to display  EKG  EKG Interpretation None       Radiology No results found.  Procedures Procedures  (including critical care time)  Medications Ordered in ED Medications - No data to display   Initial Impression / Assessment and Plan / ED Course  I have reviewed the triage vital signs and the nursing notes.  Pertinent labs & imaging results that were available during my care of the patient were reviewed by me and considered in my medical decision making (see chart for details).     A symptomatic hypertension.  We had a long discussion regarding appropriate diet and exercise regimens and controlling what she can control in regards to her hypertension.  No indication for workup in emergency department  Final Clinical Impressions(s) / ED Diagnoses   Final diagnoses:  Hypertension, unspecified type    New Prescriptions New Prescriptions   No medications on file     [place],  MD 08/18/16 1300

## 2016-08-18 NOTE — ED Triage Notes (Signed)
High BP x 2-3 months and having anxiety.  Called Dr but can't get call back

## 2017-08-12 ENCOUNTER — Other Ambulatory Visit: Payer: Self-pay

## 2017-08-12 ENCOUNTER — Encounter (HOSPITAL_BASED_OUTPATIENT_CLINIC_OR_DEPARTMENT_OTHER): Payer: Self-pay | Admitting: Adult Health

## 2017-08-12 DIAGNOSIS — I10 Essential (primary) hypertension: Secondary | ICD-10-CM | POA: Diagnosis not present

## 2017-08-12 DIAGNOSIS — M25461 Effusion, right knee: Secondary | ICD-10-CM | POA: Diagnosis not present

## 2017-08-12 DIAGNOSIS — Z79899 Other long term (current) drug therapy: Secondary | ICD-10-CM | POA: Insufficient documentation

## 2017-08-12 DIAGNOSIS — Z7982 Long term (current) use of aspirin: Secondary | ICD-10-CM | POA: Diagnosis not present

## 2017-08-12 DIAGNOSIS — Z96641 Presence of right artificial hip joint: Secondary | ICD-10-CM | POA: Insufficient documentation

## 2017-08-12 DIAGNOSIS — M25561 Pain in right knee: Secondary | ICD-10-CM | POA: Diagnosis present

## 2017-08-12 NOTE — ED Triage Notes (Signed)
PResents with right knee pain that occurred while walking to kitchen, she heard a pop and then pain. SHe reports that her foot feels a little numb. She has +1 pedal pulse and warm toes to the touch.

## 2017-08-13 ENCOUNTER — Emergency Department (HOSPITAL_BASED_OUTPATIENT_CLINIC_OR_DEPARTMENT_OTHER)
Admission: EM | Admit: 2017-08-13 | Discharge: 2017-08-13 | Disposition: A | Discharge status: Home / Self Care | Payer: Medicare Other | Attending: Emergency Medicine | Admitting: Emergency Medicine

## 2017-08-13 ENCOUNTER — Emergency Department (HOSPITAL_BASED_OUTPATIENT_CLINIC_OR_DEPARTMENT_OTHER): Payer: Medicare Other

## 2017-08-13 DIAGNOSIS — M25461 Effusion, right knee: Secondary | ICD-10-CM

## 2017-08-13 DIAGNOSIS — M25561 Pain in right knee: Secondary | ICD-10-CM

## 2017-08-13 MED ORDER — HYDROCODONE-ACETAMINOPHEN 5-325 MG PO TABS
1.0000 | ORAL_TABLET | Freq: Once | ORAL | Status: AC
  Administered 2017-08-13: 1 via ORAL
  Filled 2017-08-13: qty 1

## 2017-08-13 MED ORDER — IBUPROFEN 400 MG PO TABS: 400.0000 mg | ORAL_TABLET | Freq: Two times a day (BID) | ORAL | 0 refills | Status: AC

## 2017-08-13 MED ORDER — ACETAMINOPHEN 325 MG PO TABS
650.0000 mg | ORAL_TABLET | Freq: Once | ORAL | Status: AC
  Administered 2017-08-13: 650 mg via ORAL
  Filled 2017-08-13: qty 2

## 2017-08-13 MED ORDER — OXYCODONE-ACETAMINOPHEN 5-325 MG PO TABS: 1.0000 | ORAL_TABLET | Freq: Two times a day (BID) | ORAL | 0 refills | Status: AC | PRN

## 2017-08-13 MED ORDER — ACETAMINOPHEN ER 650 MG PO TBCR: 650.0000 mg | EXTENDED_RELEASE_TABLET | Freq: Three times a day (TID) | ORAL | 0 refills | Status: AC

## 2017-08-13 NOTE — ED Notes (Signed)
Patient has a walker at home that she will use while knee immobilizer is on. PMS intact before and after immobilizer was applied.

## 2017-08-13 NOTE — Discharge Instructions (Addendum)
He was seen in the ER for knee pain.  Likely the imaging does not show any fractures. However, there might be some injury to the ligaments and tendons going around the knee, and therefore it is prudent for you to follow-up with an orthopedic surgeon.  Call your orthopedic surgeon on Monday for the closest appointment.  If they are unable to see you, you can call the [facility] orthopedist for a follow up.  Please use the knee brace at all times when walking and use the walker as well. Be very careful when moving around, especially when you have taken strong pain medications like Percocet, so that you do not end up falling and injuring something else.

## 2017-08-14 NOTE — ED Provider Notes (Signed)
[facility] EMERGENCY DEPARTMENT Provider Note   CSN: [id number] Arrival date & time: 08/12/17  2345     History   Chief Complaint Chief Complaint  Patient presents with  . Knee Pain    HPI Lindsay Sutton is a 84 y.o. female.  HPI  84 year old female comes in with chief complaint of knee pain. Patient reports that she was walking to the bathroom when she suddenly had a pop and had severe pain.  Patient has been having difficulty ambulating since then.  She has past medical history of polymyalgia rheumatica and also right-sided hip replacement.   Past Medical History:  Diagnosis Date  . Anxiety   . Depression with anxiety   . GERD (gastroesophageal reflux disease)   . Hypertension   . Polymyalgia rheumatica [facility](HCC)     Patient Active Problem List   Diagnosis Date Noted  . Influenza-like illness 02/10/2016  . Acute respiratory failure with hypoxia (HCC) 02/10/2016  . Acute bronchitis 02/10/2016  . AKI (acute kidney injury) (HCC) 02/10/2016  . Hyponatremia 02/10/2016  . Acute respiratory failure (HCC) 02/10/2016  . GERD (gastroesophageal reflux disease)   . Hypertension   . Depression with anxiety     Past Surgical History:  Procedure Laterality Date  . ABDOMINAL HYSTERECTOMY    . CHOLECYSTECTOMY       OB History   None      Home Medications    Prior to Admission medications   Medication Sig Start Date End Date Taking? Authorizing Provider  acetaminophen (TYLENOL 8 HOUR) 650 MG CR tablet Take 1 tablet (650 mg total) by mouth every 8 (eight) hours. 08/13/17   Derwood KaplanNanavati, Sherisse Fullilove, MD  ALPRAZolam Prudy Feeler(XANAX) 0.25 MG tablet Take 0.125 mg by mouth 3 (three) times daily as needed (for anxiety).     [provider]  aspirin 81 MG chewable tablet Chew 81 mg by mouth every other day.     [provider]  azithromycin (ZITHROMAX Z-PAK) 250 MG tablet Take as directed 02/13/16   Calvert Cantorizwan, Saima, MD  Calcium-Vitamin D-Vitamin K (VIACTIV PO) Take 1  each by mouth 2 (two) times daily.    [provider]  chlorpheniramine (CHLOR-TRIMETON) 2 MG/5ML syrup Take 4 mg by mouth as needed for allergies.    [provider]  cholecalciferol (VITAMIN D) 1000 units tablet Take 1,000 Units by mouth daily.    [provider]  dextromethorphan-guaiFENesin (MUCINEX DM) 30-600 MG 12hr tablet Take 1 tablet by mouth 2 (two) times daily as needed for cough. 02/13/16   Calvert Cantorizwan, Saima, MD  dicyclomine (BENTYL) 20 MG tablet Take 20 mg by mouth as needed for spasms.    [provider]  diphenhydrAMINE (BENADRYL) 25 mg capsule Take 25 mg by mouth at bedtime as needed for sleep.     [provider]  Docusate Calcium (STOOL SOFTENER PO) Take 1 capsule by mouth 2 (two) times daily.     [provider]  escitalopram (LEXAPRO) 20 MG tablet Take 20 mg by mouth daily.    [provider]  ibuprofen (ADVIL,MOTRIN) 400 MG tablet Take 1 tablet (400 mg total) by mouth every 12 (twelve) hours. 08/13/17   Derwood KaplanNanavati, Demari Gales, MD  Naphazoline HCl (NAPHCON OP) Place 1-4 drops into both eyes as needed (for allergies).     [provider]  nebivolol (BYSTOLIC) 2.5 MG tablet Take 1.25 mg by mouth daily.    [provider]  olmesartan (BENICAR) 40 MG tablet Take 40 mg by mouth daily.  [provider]  omeprazole (PRILOSEC) 10 MG capsule Take 10 mg by mouth at bedtime.    [provider]  oxyCODONE-acetaminophen (PERCOCET/ROXICET) 5-325 MG tablet Take 1 tablet by mouth every 12 (twelve) hours as needed for severe pain. 08/13/17   Derwood Kaplan, MD  Polyethylene Glycol 400 (BLINK TEARS OP) Place 1 drop into both eyes as needed (for dry eyes).    [provider]  predniSONE (DELTASONE) 50 MG tablet 50 mg tomorrow, taper by 10 mg daily until down to 20 mg. Then stop and resume 15 mg. 02/13/16   Calvert Cantor, MD  Probiotic Product (PROBIOTIC DAILY PO) Take 1 capsule by mouth daily.      [provider]  Psyllium (METAMUCIL FIBER PO) Take 1 each by mouth 2 (two) times daily.     [provider]    Family History Family History  Problem Relation Age of Onset  . Heart attack Father   . Bladder Cancer Sister     Social History Social History   Tobacco Use  . Smoking status: Never Smoker  . Smokeless tobacco: Never Used  Substance Use Topics  . Alcohol use: No  . Drug use: No     Allergies   Floxin [ofloxacin]; Ceftin [cefuroxime axetil]; Ciprofloxacin; Cymbalta [duloxetine hcl]; Detrol [tolterodine]; Fish allergy; Levaquin [levofloxacin]; Mobic [meloxicam]; Penicillins; Percocet [oxycodone-acetaminophen]; Rocephin [ceftriaxone]; Septra [sulfamethoxazole-trimethoprim]; Sulfa antibiotics; Dilaudid [hydromorphone]; Morphine and related; and Pantoprazole   Review of Systems Review of Systems  Musculoskeletal: Positive for arthralgias, gait problem and joint swelling.  Skin: Negative for wound.  Allergic/Immunologic: Negative for immunocompromised state.  Hematological: Does not bruise/bleed easily.     Physical Exam Updated Vital Signs BP (!) 196/91 (BP Location: Right Arm)   Pulse 68   Temp 98.2 F (36.8 C) (Oral)   Resp 18   LMP  (LMP Unknown)   SpO2 100%   Physical Exam  Constitutional: She is oriented to person, place, and time. She appears well-developed.  HENT:  Head: Normocephalic and atraumatic.  Eyes: EOM are normal.  Neck: Normal range of motion. Neck supple.  Cardiovascular: Normal rate.  Pulmonary/Chest: Effort normal.  Abdominal: Bowel sounds are normal.  Musculoskeletal: She exhibits edema and tenderness.  Right knee exam does not reveal any laxity upon anterior and posterior drawer.  Patient is having tenderness with valgus and varus stress.  Additionally, she is having difficulty bearing weight which is not unusual for the patient  Neurological: She is alert and oriented to person, place, and time.  Skin: Skin is  warm and dry.  Nursing note and vitals reviewed.    ED Treatments / Results  Labs (all labs ordered are listed, but only abnormal results are displayed) Labs Reviewed - No data to display  EKG None  Radiology Ct Knee Right Wo Contrast  Result Date: 08/13/2017 CLINICAL DATA:  84 year old female with trauma to the right knee. Concern for fracture. EXAM: CT OF THE right KNEE WITHOUT CONTRAST TECHNIQUE: Multidetector CT imaging of the right knee was performed according to the standard protocol. Multiplanar CT image reconstructions were also generated. COMPARISON:  Right knee radiograph dated 08/13/2017 FINDINGS: Bones/Joint/Cartilage There is no acute fracture or dislocation. Partially visualized femoral intramedullary fixation rod and screws. The visualized portion of the orthopedic hardware appears intact. The bones are osteopenic. There is moderate suprapatellar effusion. Ligaments Suboptimally assessed by CT. Muscles and Tendons No acute findings. Soft tissues No acute findings. IMPRESSION: 1. No acute fracture or dislocation. 2. Moderate suprapatellar effusion  Electronically Signed   By: Elgie Collard M.D.   On: 08/13/2017 04:02   Dg Knee Complete 4 Views Right  Result Date: 08/13/2017 CLINICAL DATA:  Acute right knee pain x1 week. EXAM: RIGHT KNEE - COMPLETE 4+ VIEW COMPARISON:  None. FINDINGS: The included intramedullary nail with interlocking screw appears intact within the distal femoral diaphysis. Joint space narrowing of the femorotibial compartment is identified with trace joint effusion. Slight flattening along the medial aspect of the medial femoral condyle may reflect a subtle subchondral insufficiency fracture. The proximal tibia and fibula appear intact. IMPRESSION: Subtle flattening of the medial femoral condyle raising suspicion for a subtle subchondral insufficiency fracture of the femur. Trace joint effusion. Electronically Signed   By: Tollie Eth M.D.   On: 08/13/2017 00:45      Procedures Procedures (including critical care time)  Medications Ordered in ED Medications  acetaminophen (TYLENOL) tablet 650 mg (650 mg Oral Given 08/13/17 0225)  HYDROcodone-acetaminophen (NORCO/VICODIN) 5-325 MG per tablet 1 tablet (1 tablet Oral Given 08/13/17 1610)     Initial Impression / Assessment and Plan / ED Course  I have reviewed the triage vital signs and the nursing notes.  Pertinent labs & imaging results that were available during my care of the patient were reviewed by me and considered in my medical decision making (see chart for details).     84 year old female comes in with chief complaint of knee pain.  Patient was ambulating when she had a sudden pop, and since then she is been having difficulty ambulating.  On exam patient has edematous and tender right knee, with no laxity.  Patient is unable to ambulate without significant discomfort.  X-ray is equivocal for fracture, CT scan ordered and is negative.  Knee immobilizer placed and patient ambulated again with walker and she did better. We will discharge her with precautions, and we have strongly urged to her to take care of herself and prevent any falls.  Final Clinical Impressions(s) / ED Diagnoses   Final diagnoses:  Effusion of right knee  Acute pain of right knee    ED Discharge Orders        Ordered    acetaminophen (TYLENOL 8 HOUR) 650 MG CR tablet  Every 8 hours     08/13/17 0508    ibuprofen (ADVIL,MOTRIN) 400 MG tablet  Every 12 hours     08/13/17 0508    oxyCODONE-acetaminophen (PERCOCET/ROXICET) 5-325 MG tablet  Every 12 hours PRN     08/13/17 0508       Derwood Kaplan, MD 08/14/17 [phone]

## 2018-07-09 ENCOUNTER — Emergency Department (HOSPITAL_BASED_OUTPATIENT_CLINIC_OR_DEPARTMENT_OTHER): Payer: Medicare Other

## 2018-07-09 ENCOUNTER — Encounter (HOSPITAL_BASED_OUTPATIENT_CLINIC_OR_DEPARTMENT_OTHER): Payer: Self-pay | Admitting: Emergency Medicine

## 2018-07-09 ENCOUNTER — Emergency Department (HOSPITAL_BASED_OUTPATIENT_CLINIC_OR_DEPARTMENT_OTHER)
Admission: EM | Admit: 2018-07-09 | Discharge: 2018-07-09 | Disposition: A | Discharge status: Home / Self Care | Payer: Medicare Other | Attending: Emergency Medicine | Admitting: Emergency Medicine

## 2018-07-09 ENCOUNTER — Other Ambulatory Visit: Payer: Self-pay

## 2018-07-09 DIAGNOSIS — R05 Cough: Secondary | ICD-10-CM | POA: Insufficient documentation

## 2018-07-09 DIAGNOSIS — R51 Headache: Secondary | ICD-10-CM | POA: Insufficient documentation

## 2018-07-09 DIAGNOSIS — Z20828 Contact with and (suspected) exposure to other viral communicable diseases: Secondary | ICD-10-CM | POA: Insufficient documentation

## 2018-07-09 DIAGNOSIS — R42 Dizziness and giddiness: Secondary | ICD-10-CM | POA: Diagnosis not present

## 2018-07-09 DIAGNOSIS — R0981 Nasal congestion: Secondary | ICD-10-CM | POA: Diagnosis present

## 2018-07-09 DIAGNOSIS — I10 Essential (primary) hypertension: Secondary | ICD-10-CM | POA: Diagnosis not present

## 2018-07-09 DIAGNOSIS — R531 Weakness: Secondary | ICD-10-CM | POA: Insufficient documentation

## 2018-07-09 LAB — CBC WITH DIFFERENTIAL/PLATELET
Abs Immature Granulocytes: 0.11 10*3/uL — ABNORMAL HIGH (ref 0.00–0.07)
Basophils Absolute: 0 10*3/uL (ref 0.0–0.1)
Basophils Relative: 0 %
Eosinophils Absolute: 0.1 10*3/uL (ref 0.0–0.5)
Eosinophils Relative: 1 %
HCT: 35.9 % — ABNORMAL LOW (ref 36.0–46.0)
Hemoglobin: 11.3 g/dL — ABNORMAL LOW (ref 12.0–15.0)
Immature Granulocytes: 1 %
Lymphocytes Relative: 27 %
Lymphs Abs: 3.2 10*3/uL (ref 0.7–4.0)
MCH: 27.9 pg (ref 26.0–34.0)
MCHC: 31.5 g/dL (ref 30.0–36.0)
MCV: 88.6 fL (ref 80.0–100.0)
Monocytes Absolute: 1 10*3/uL (ref 0.1–1.0)
Monocytes Relative: 9 %
Neutro Abs: 7.4 10*3/uL (ref 1.7–7.7)
Neutrophils Relative %: 62 %
Platelets: 401 10*3/uL — ABNORMAL HIGH (ref 150–400)
RBC: 4.05 MIL/uL (ref 3.87–5.11)
RDW: 13.1 % (ref 11.5–15.5)
WBC: 11.9 10*3/uL — ABNORMAL HIGH (ref 4.0–10.5)
nRBC: 0 % (ref 0.0–0.2)

## 2018-07-09 LAB — URINALYSIS, ROUTINE W REFLEX MICROSCOPIC
Bilirubin Urine: NEGATIVE
Glucose, UA: NEGATIVE mg/dL
Hgb urine dipstick: NEGATIVE
Ketones, ur: NEGATIVE mg/dL
Leukocytes,Ua: NEGATIVE
Nitrite: NEGATIVE
Protein, ur: NEGATIVE mg/dL
Specific Gravity, Urine: 1.015 (ref 1.005–1.030)
pH: 7.5 (ref 5.0–8.0)

## 2018-07-09 LAB — COMPREHENSIVE METABOLIC PANEL
ALT: 16 U/L (ref 0–44)
AST: 19 U/L (ref 15–41)
Albumin: 4.3 g/dL (ref 3.5–5.0)
Alkaline Phosphatase: 71 U/L (ref 38–126)
Anion gap: 12 (ref 5–15)
BUN: 26 mg/dL — ABNORMAL HIGH (ref 8–23)
CO2: 25 mmol/L (ref 22–32)
Calcium: 9.4 mg/dL (ref 8.9–10.3)
Chloride: 93 mmol/L — ABNORMAL LOW (ref 98–111)
Creatinine, Ser: 1.26 mg/dL — ABNORMAL HIGH (ref 0.44–1.00)
GFR calc Af Amer: 44 mL/min — ABNORMAL LOW (ref >60)
GFR calc non Af Amer: 38 mL/min — ABNORMAL LOW (ref >60)
Glucose, Bld: 131 mg/dL — ABNORMAL HIGH (ref 70–99)
Potassium: 4.1 mmol/L (ref 3.5–5.1)
Sodium: 130 mmol/L — ABNORMAL LOW (ref 135–145)
Total Bilirubin: 0.4 mg/dL (ref 0.3–1.2)
Total Protein: 7.8 g/dL (ref 6.5–8.1)

## 2018-07-09 LAB — SARS CORONAVIRUS 2 AG (30 MIN TAT): SARS Coronavirus 2 Ag: NEGATIVE

## 2018-07-09 LAB — TROPONIN I: Troponin I: <0.03 ng/mL (ref <0.03)

## 2018-07-09 MED ORDER — AMLODIPINE BESYLATE 2.5 MG PO TABS: 2.5000 mg | ORAL_TABLET | Freq: Every day | ORAL | 0 refills | Status: AC

## 2018-07-09 NOTE — ED Notes (Signed)
Pt made aware that we need a urine sample.

## 2018-07-09 NOTE — ED Notes (Signed)
Greggory Stallion [phone]

## 2018-07-09 NOTE — ED Notes (Signed)
Pt understood dc material. NAD noted. Script given at [employer]. All questions answered to satisfaction. Pt escorted to check out window and then helped into car and spouse updated.

## 2018-07-09 NOTE — ED Triage Notes (Signed)
Pt states that around noon she felt like she started to get a cold. Then ate some supper. She started to experience weakness and feeling of passing out. Hx of panic attacks.

## 2018-07-09 NOTE — ED Provider Notes (Signed)
Emergency Department Provider Note   I have reviewed the triage vital signs and the nursing notes.   HISTORY  Chief Complaint Weakness   HPI Lindsay Fateleanor Sutton is a 85 y.o. female with PMH of anxiety, GERD, HTN, and Polymyalgia Rheumatica presents to the emergency department for evaluation of congestion, cough, weakness, and lightheadedness.  Symptoms began after eating dinner.  She denies any vertigo, unilateral weakness, numbness.  No chest pain.  He states that she is had a cough for several weeks and it is nonproductive.  She has not noticed any fevers or shaking chills.  She notes a history of anxiety and states that this morning she did take a quarter tab of her Xanax and believes it may be related to her current symptoms.  She denies any abdominal pain, vomiting, diarrhea.  No new medications.  She has not taken her PM hypertension medication.  She denies any vision changes.  She reports mild headache but nothing severe.  She tells me that overall her symptoms have improved significantly since onset.   Past Medical History:  Diagnosis Date  . Anxiety   . Depression with anxiety   . GERD (gastroesophageal reflux disease)   . Hypertension   . Polymyalgia rheumatica [facility](HCC)     Patient Active Problem List   Diagnosis Date Noted  . Influenza-like illness 02/10/2016  . Acute respiratory failure with hypoxia (HCC) 02/10/2016  . Acute bronchitis 02/10/2016  . AKI (acute kidney injury) (HCC) 02/10/2016  . Hyponatremia 02/10/2016  . Acute respiratory failure (HCC) 02/10/2016  . GERD (gastroesophageal reflux disease)   . Hypertension   . Depression with anxiety     Past Surgical History:  Procedure Laterality Date  . ABDOMINAL HYSTERECTOMY    . CHOLECYSTECTOMY      Allergies Floxin [ofloxacin]; Ceftin [cefuroxime axetil]; Ciprofloxacin; Cymbalta [duloxetine hcl]; Detrol [tolterodine]; Fish allergy; Levaquin [levofloxacin]; Mobic [meloxicam]; Penicillins; Percocet  [oxycodone-acetaminophen]; Rocephin [ceftriaxone]; Septra [sulfamethoxazole-trimethoprim]; Sulfa antibiotics; Dilaudid [hydromorphone]; Morphine and related; and Pantoprazole  Family History  Problem Relation Age of Onset  . Heart attack Father   . Bladder Cancer Sister     Social History Social History   Tobacco Use  . Smoking status: Never Smoker  . Smokeless tobacco: Never Used  Substance Use Topics  . Alcohol use: No  . Drug use: No    Review of Systems  Constitutional: No fever/chills. Positive weakness/lightehadedness (resolved).  Eyes: No visual changes. ENT: No sore throat. Positive nasal congestion.  Cardiovascular: Denies chest pain. Respiratory: Denies shortness of breath. Positive cough.  Gastrointestinal: No abdominal pain. No nausea, no vomiting.  No diarrhea.  No constipation. Genitourinary: Negative for dysuria. Musculoskeletal: Negative for back pain. Skin: Negative for rash. Neurological: Negative for headaches, focal weakness or numbness.  10-point ROS otherwise negative.  ____________________________________________   PHYSICAL EXAM:  VITAL SIGNS: ED Triage Vitals  Enc Vitals Group     BP 07/09/18 1938 (!) 221/79     Pulse Rate 07/09/18 1936 76     Resp 07/09/18 1936 18     Temp 07/09/18 1938 98.5 F (36.9 C)     Temp Source 07/09/18 1938 Oral     SpO2 07/09/18 1936 100 %     Weight 07/09/18 1933 167 lb (75.8 kg)     Height 07/09/18 1933 5' (1.524 m)     Pain Score 07/09/18 1933 0   Constitutional: Alert and oriented. Well appearing and in no acute distress. Eyes: Conjunctivae are normal. Head: Atraumatic. Nose: No congestion/rhinnorhea.  Mouth/Throat: Mucous membranes are moist.  Oropharynx non-erythematous. Neck: No stridor.   Cardiovascular: Normal rate, regular rhythm. Good peripheral circulation. Grossly normal heart sounds.   Respiratory: Normal respiratory effort.  No retractions. Lungs CTAB. Gastrointestinal: Soft and nontender.  No distention.  Musculoskeletal: No lower extremity tenderness nor edema. No gross deformities of extremities. Neurologic:  Normal speech and language. No gross focal neurologic deficits are appreciated.  Skin:  Skin is warm, dry and intact. No rash noted.  ____________________________________________   LABS (all labs ordered are listed, but only abnormal results are displayed)  Labs Reviewed  COMPREHENSIVE METABOLIC PANEL - Abnormal; Notable for the following components:      Result Value   Sodium 130 (*)    Chloride 93 (*)    Glucose, Bld 131 (*)    BUN 26 (*)    Creatinine, Ser 1.26 (*)    GFR calc non Af Amer 38 (*)    GFR calc Af Amer 44 (*)    All other components within normal limits  CBC WITH DIFFERENTIAL/PLATELET - Abnormal; Notable for the following components:   WBC 11.9 (*)    Hemoglobin 11.3 (*)    HCT 35.9 (*)    Platelets 401 (*)    Abs Immature Granulocytes 0.11 (*)    All other components within normal limits  SARS CORONAVIRUS 2 (HOSP ORDER, PERFORMED IN [place] [facility] VIA ABBOTT ID)  TROPONIN I  URINALYSIS, ROUTINE W REFLEX MICROSCOPIC   ____________________________________________  EKG   EKG Interpretation  Date/Time:  Monday Jul 09 2018 19:35:00 EDT Ventricular Rate:  79 PR Interval:    QRS Duration: 81 QT Interval:  332 QTC Calculation: 381 R Axis:   56 Text Interpretation:  Sinus rhythm Nonspecific repol abnormality, diffuse leads No STEMI. ST changes similar to prior.  Confirmed by Alona Bene [phone]) on 07/09/2018 7:38:14 PM       ____________________________________________  RADIOLOGY  Dg Chest Portable 1 View  Result Date: 07/09/2018 CLINICAL DATA:  Cough EXAM: PORTABLE CHEST 1 VIEW COMPARISON:  04/02/2018 FINDINGS: The heart size and mediastinal contours are within normal limits. Both lungs are clear. The visualized skeletal structures are unremarkable. IMPRESSION: No active disease. Electronically Signed   By: Marlan Palau M.D.    On: 07/09/2018 20:07    ____________________________________________   PROCEDURES  Procedure(s) performed:   Procedures  None  ____________________________________________   INITIAL IMPRESSION / ASSESSMENT AND PLAN / ED COURSE  Pertinent labs & imaging results that were available during my care of the patient were reviewed by me and considered in my medical decision making (see chart for details).   Patient presents to the emergency department for evaluation of acute onset nasal congestion with some ongoing cough, weakness, lightheadedness.  Seem to have mostly resolved at this time.  She is not experiencing any chest pain, palpitations, shortness of breath.  Her EKG shows nonspecific ST changes which was present on prior EKGs in the system.  Patient does have significantly elevated blood pressure here but no other acute findings to suspect hypertensive emergency.  I will give her home/evening blood pressure medications which she has not yet taken.  Plan for screening labs, chest x-ray, and SARS-CoV-2 testing given respiratory symptoms and increased age/risk for complications.   Imaging and labs reviewed.  UA without infection.  Patient does have hyponatremia which is similar to her prior values.  Patient has been feeling well since arrival in the emergency department.  She is eating and ambulatory here.  Do not feel that hospitalization would be warranted at this time.  EKG unremarkable.  Plan for PCP follow-up.  I did refill her home blood pressure medication. Discussed strict ED return precautions.  ____________________________________________  FINAL CLINICAL IMPRESSION(S) / ED DIAGNOSES  Final diagnoses:  Generalized weakness    NEW OUTPATIENT MEDICATIONS STARTED DURING THIS VISIT:  Discharge Medication List as of 07/09/2018  9:34 PM    START taking these medications   Details  amLODipine (NORVASC) 2.5 MG tablet Take 1 tablet (2.5 mg total) by mouth daily for 30 days.,  Starting Mon 07/09/2018, Until Wed 08/08/2018, Print        Note:  This document was prepared using Dragon voice recognition software and may include unintentional dictation errors.  Alona Bene, MD Emergency Medicine    Chloe Baig, Arlyss Repress, MD 07/09/18 [phone]

## 2018-07-09 NOTE — ED Notes (Signed)
X-ray at bedside

## 2018-07-09 NOTE — Discharge Instructions (Signed)
You were seen in the emergency department with weakness.  Your lab work showed that your sodium remains low but not lower than normal.  You are showing no sign of infection or strain on your heart.  Take your home medications as prescribed.  Call your PCP to schedule a follow-up appointment in the coming week.  Return to the emergency department with any sudden worsening or new symptoms.

## 2018-07-09 NOTE — ED Notes (Signed)
Pt laid flat for [facility]

## 2018-07-09 NOTE — ED Notes (Signed)
Pt was given water for PO challenge. She also had her home medications that EDP stated she could take.

## 2018-07-09 NOTE — ED Notes (Signed)
pts spouse given update per patient request

## 2018-07-09 NOTE — ED Notes (Signed)
ED Provider at bedside. 

## 2019-09-23 IMAGING — CT CT KNEE*R* W/O CM
3 series · 12 of 33 positions shown, 14 images · non-contrast
Comparison: Right knee radiograph dated 08/13/2017

CLINICAL DATA: 89-year-old female with trauma to the right knee.
Concern for fracture.

EXAM:
CT OF THE right KNEE WITHOUT CONTRAST
TECHNIQUE: Multidetector CT imaging of the right knee was performed according
to the standard protocol. Multiplanar CT image reconstructions were
also generated.

[Series 3: axial bone · axial · 0.46mm/px · z∈[-854,-628]mm · 4 of 163 slices shown, 5 images]
[im 25/163  soft-tissue]
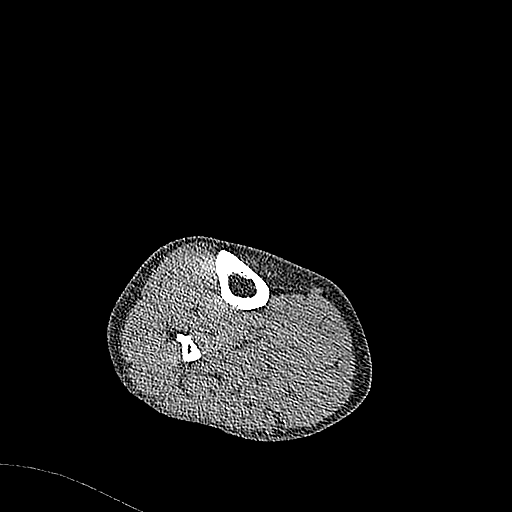
[im 25/163  bone]
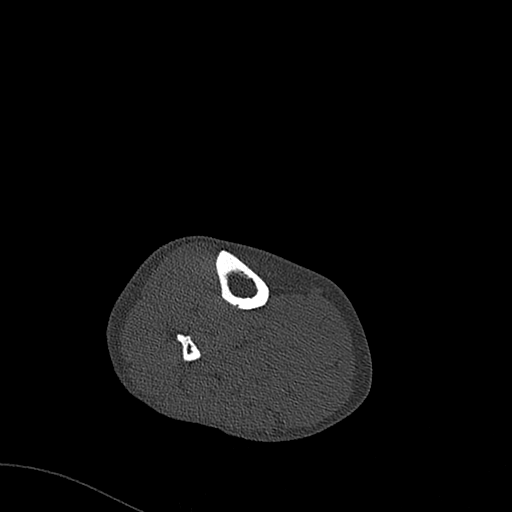
[im 63/163  bone]
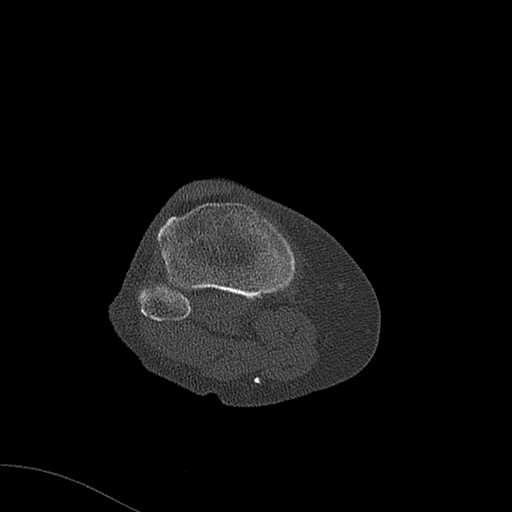
[im 100/163  bone]
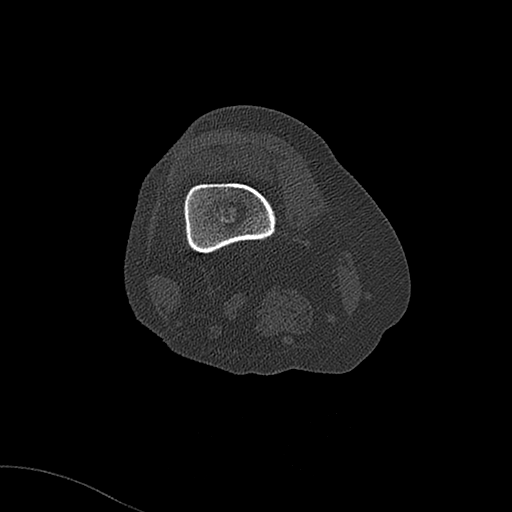
[im 138/163  bone]
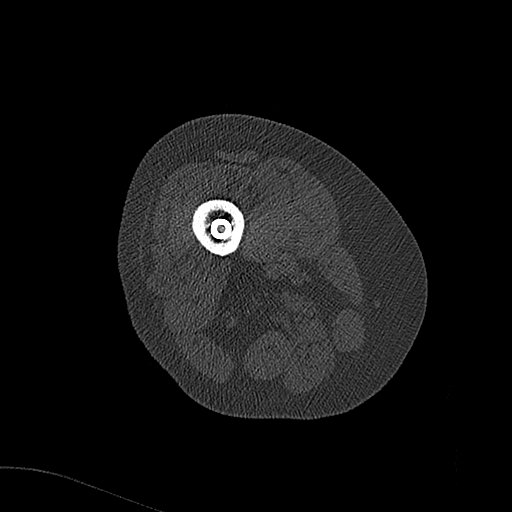

[Series 8: coronal st · coronal · 0.39mm/px · 3 of 88 slices shown]
[im 18/88  bone]
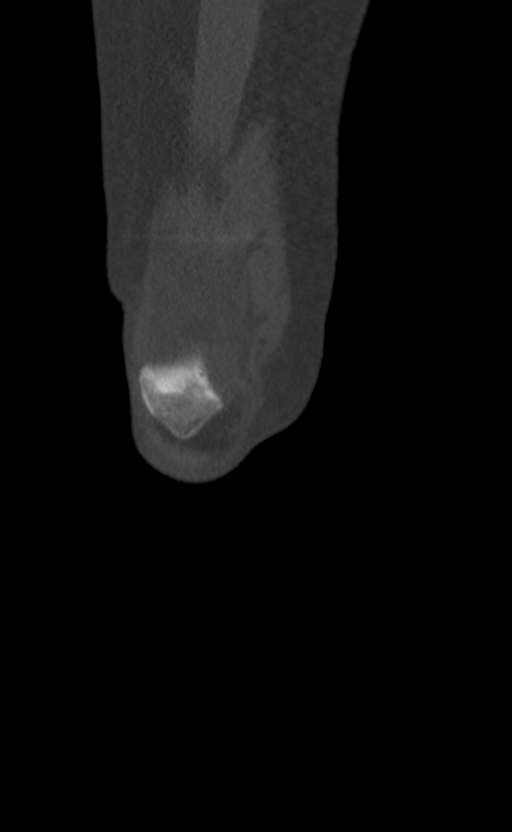
[im 35/88  bone]
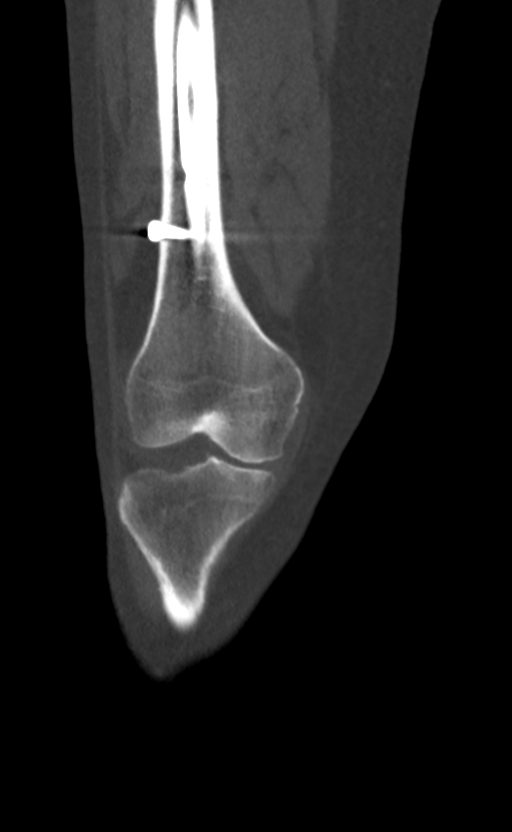
[im 53/88  bone]
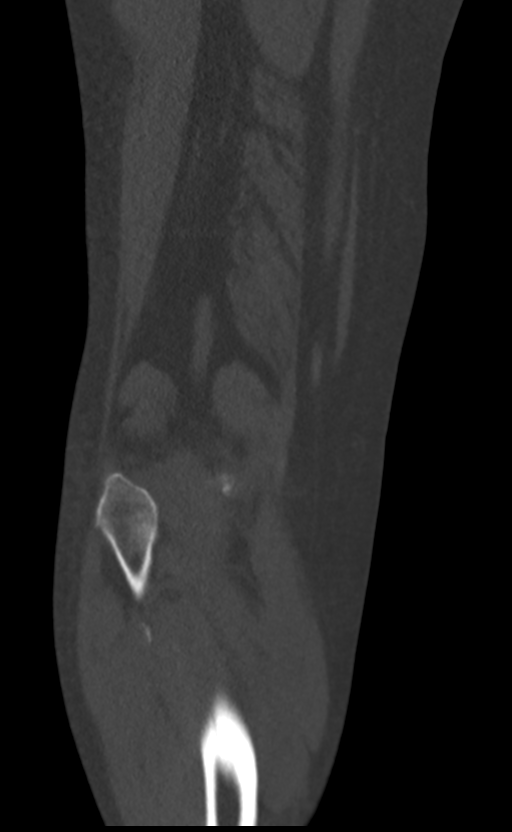

[Series 9: sagittal st · sagittal · 0.38mm/px · 5 of 57 slices shown, 6 images]
[im 19/57  bone]
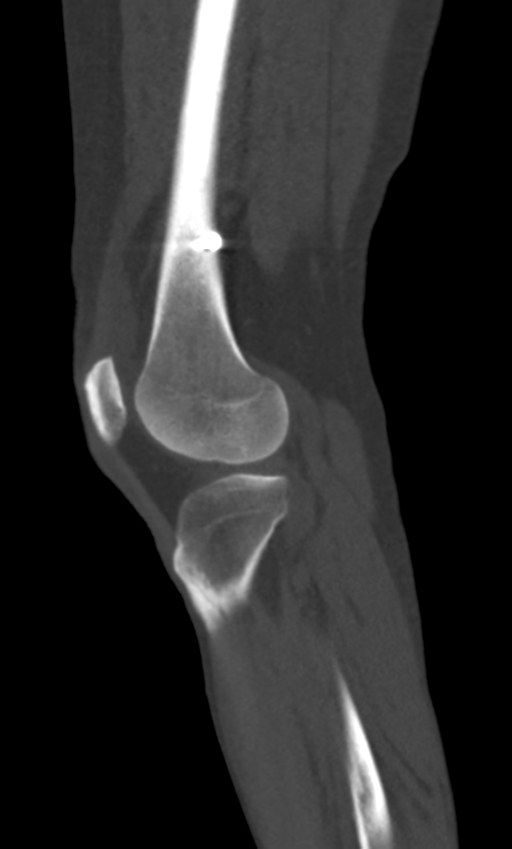
[im 24/57  bone]
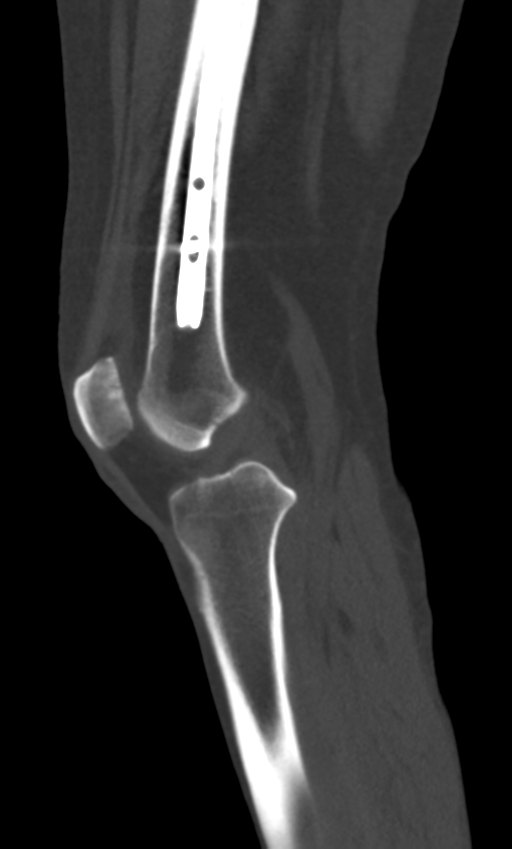
[im 29/57  soft-tissue]
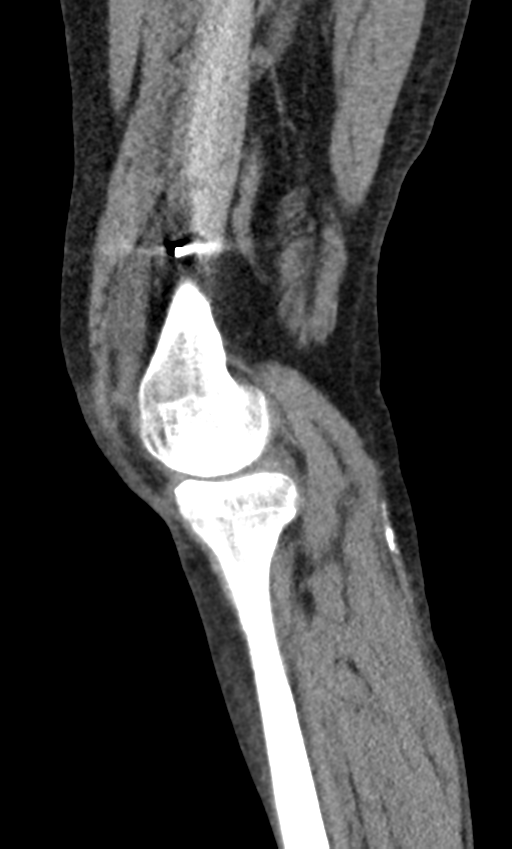
[im 29/57  bone]
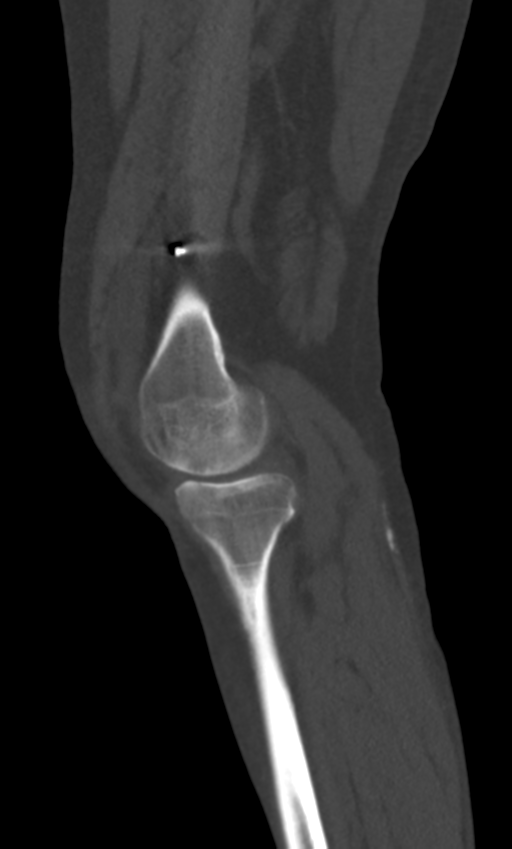
[im 33/57  bone]
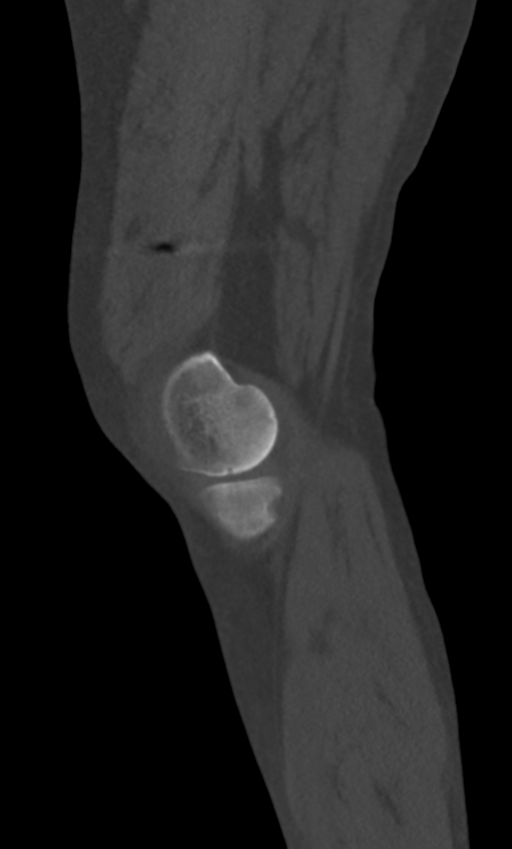
[im 38/57  bone]
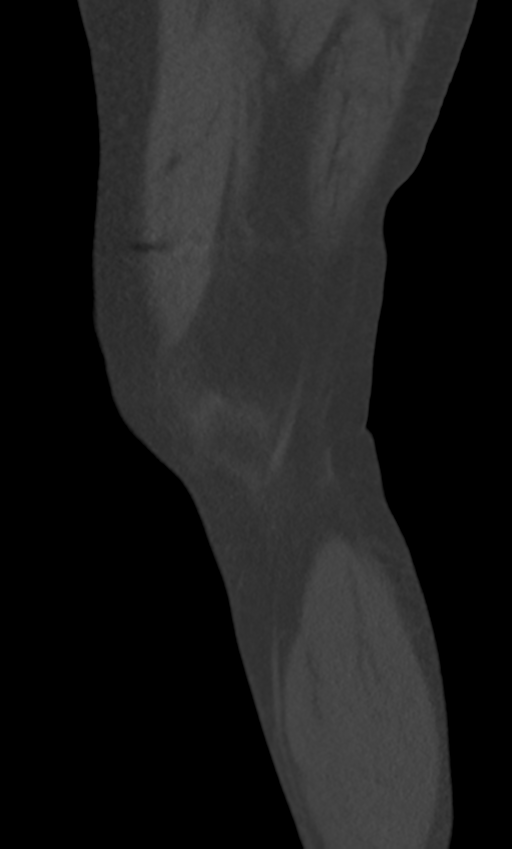

[12 of 33 positions shown; findings below may reference images not displayed]

FINDINGS: Bones/Joint/Cartilage

There is no acute fracture or dislocation. Partially visualized
femoral intramedullary fixation rod and screws. The visualized
portion of the orthopedic hardware appears intact. The bones are
osteopenic. There is moderate suprapatellar effusion.

Ligaments

Suboptimally assessed by CT.

Muscles and Tendons

No acute findings.

Soft tissues

No acute findings.
IMPRESSION: 1. No acute fracture or dislocation.
2. Moderate suprapatellar effusion

## 2020-07-15 ENCOUNTER — Ambulatory Visit: Payer: Medicare Other

## 2020-07-15 NOTE — Progress Notes (Signed)
   Covid-[facility] Vaccination [facility]  Name:  Lindsay Sutton    MRN: [id number] DOB: November 24, 1928  07/15/2020  Lindsay Sutton was observed post Covid-19 immunization for 15 minutes without incident. She was provided with Vaccine Information Sheet and instruction to access the V-Safe system.   Lindsay Sutton was instructed to call [phone] with any severe reactions post vaccine: Marland Kitchen Difficulty breathing  . Swelling of face and throat  . A fast heartbeat  . A bad rash all over body  . Dizziness and weakness

## 2020-07-19 ENCOUNTER — Other Ambulatory Visit: Payer: Self-pay

## 2020-07-19 ENCOUNTER — Encounter (HOSPITAL_BASED_OUTPATIENT_CLINIC_OR_DEPARTMENT_OTHER): Payer: Self-pay | Admitting: Emergency Medicine

## 2020-07-19 ENCOUNTER — Emergency Department (HOSPITAL_BASED_OUTPATIENT_CLINIC_OR_DEPARTMENT_OTHER)
Admission: EM | Admit: 2020-07-19 | Discharge: 2020-07-19 | Disposition: A | Discharge status: Home / Self Care | Payer: Medicare Other | Attending: Emergency Medicine | Admitting: Emergency Medicine

## 2020-07-19 DIAGNOSIS — M545 Low back pain, unspecified: Secondary | ICD-10-CM | POA: Diagnosis not present

## 2020-07-19 DIAGNOSIS — Z7982 Long term (current) use of aspirin: Secondary | ICD-10-CM | POA: Diagnosis not present

## 2020-07-19 DIAGNOSIS — I10 Essential (primary) hypertension: Secondary | ICD-10-CM | POA: Insufficient documentation

## 2020-07-19 DIAGNOSIS — Z79899 Other long term (current) drug therapy: Secondary | ICD-10-CM | POA: Diagnosis not present

## 2020-07-19 MED ORDER — OXYCODONE HCL 5 MG PO TABS
5.0000 mg | ORAL_TABLET | Freq: Once | ORAL | Status: AC
  Administered 2020-07-19: 5 mg via ORAL
  Filled 2020-07-19: qty 1

## 2020-07-19 MED ORDER — OXYCODONE HCL 5 MG PO TABS: 5.0000 mg | ORAL_TABLET | Freq: Three times a day (TID) | ORAL | 0 refills | Status: DC | PRN

## 2020-07-19 MED ORDER — OXYCODONE HCL 5 MG PO TABS: 5.0000 mg | ORAL_TABLET | Freq: Three times a day (TID) | ORAL | 0 refills | Status: AC | PRN

## 2020-07-19 NOTE — ED Triage Notes (Signed)
Pt c/o mid back pain. Pt has history of same and is due for epidural on Wednesday but the pain is unbearable.

## 2020-07-19 NOTE — Discharge Instructions (Addendum)
Take 25 mg of Benadryl if you develop any itching with your pain medicine.  Recommend taking 1000 mg of Tylenol every 6 hours as needed for pain.  Please stop your Tylenol 3 at this time and use Roxicodone and Tylenol individually instead.

## 2020-07-19 NOTE — ED Provider Notes (Signed)
[facility] EMERGENCY DEPARTMENT Provider Note   CSN: [id number] Arrival date & time: 07/19/20  1345     History Chief Complaint  Patient presents with  . Back Pain    Lindsay Sutton is a 87 y.o. female.  The history is provided by the patient.  Back Pain Location:  Lumbar spine Quality:  Aching Radiates to:  Does not radiate Pain severity:  Mild Onset quality:  Gradual Timing:  Intermittent Progression:  Waxing and waning Chronicity:  New Context comment:  Known lumbar compression fx, due for epidural this week. Tylenol 3 not helping.  Relieved by:  Nothing Worsened by:  Palpation and movement Associated symptoms: no abdominal pain, no abdominal swelling, no bladder incontinence, no bowel incontinence, no chest pain, no dysuria, no fever, no headaches, no leg pain, no numbness, no paresthesias, no pelvic pain, no perianal numbness, no tingling, no weakness and no weight loss        Past Medical History:  Diagnosis Date  . Anxiety   . Depression with anxiety   . GERD (gastroesophageal reflux disease)   . Hypertension   . Polymyalgia rheumatica [facility])     Patient Active Problem List   Diagnosis Date Noted  . Influenza-like illness 02/10/2016  . Acute respiratory failure with hypoxia (HCC) 02/10/2016  . Acute bronchitis 02/10/2016  . AKI (acute kidney injury) (HCC) 02/10/2016  . Hyponatremia 02/10/2016  . Acute respiratory failure (HCC) 02/10/2016  . GERD (gastroesophageal reflux disease)   . Hypertension   . Depression with anxiety     Past Surgical History:  Procedure Laterality Date  . ABDOMINAL HYSTERECTOMY    . CHOLECYSTECTOMY       OB History   No obstetric history on file.     Family History  Problem Relation Age of Onset  . Heart attack Father   . Bladder Cancer Sister     Social History   Tobacco Use  . Smoking status: Never Smoker  . Smokeless tobacco: Never Used  Vaping Use  . Vaping Use: Never used  Substance Use  Topics  . Alcohol use: No  . Drug use: No    Home Medications Prior to Admission medications   Medication Sig Start Date End Date Taking? Authorizing Provider  acetaminophen (TYLENOL 8 HOUR) 650 MG CR tablet Take 1 tablet (650 mg total) by mouth every 8 (eight) hours. 08/13/17   Derwood Kaplan, MD  ALPRAZolam Prudy Feeler) 0.25 MG tablet Take 0.125 mg by mouth 3 (three) times daily as needed (for anxiety).     [provider]  amLODipine (NORVASC) 2.5 MG tablet Take 1 tablet (2.5 mg total) by mouth daily for 30 days. 07/09/18 08/08/18  Long, Arlyss Repress, MD  aspirin 81 MG chewable tablet Chew 81 mg by mouth every other day.     [provider]  azithromycin (ZITHROMAX Z-PAK) 250 MG tablet Take as directed 02/13/16   Calvert Cantor, MD  Calcium-Vitamin D-Vitamin K (VIACTIV PO) Take 1 each by mouth 2 (two) times daily.    [provider]  chlorpheniramine (CHLOR-TRIMETON) 2 MG/5ML syrup Take 4 mg by mouth as needed for allergies.    [provider]  cholecalciferol (VITAMIN D) 1000 units tablet Take 1,000 Units by mouth daily.    [provider]  dextromethorphan-guaiFENesin (MUCINEX DM) 30-600 MG 12hr tablet Take 1 tablet by mouth 2 (two) times daily as needed for cough. 02/13/16   Calvert Cantor, MD  dicyclomine (BENTYL) 20 MG tablet Take 20 mg by  mouth as needed for spasms.    [provider]  diphenhydrAMINE (BENADRYL) 25 mg capsule Take 25 mg by mouth at bedtime as needed for sleep.     [provider]  Docusate Calcium (STOOL SOFTENER PO) Take 1 capsule by mouth 2 (two) times daily.     [provider]  escitalopram (LEXAPRO) 20 MG tablet Take 20 mg by mouth daily.    [provider]  ibuprofen (ADVIL,MOTRIN) 400 MG tablet Take 1 tablet (400 mg total) by mouth every 12 (twelve) hours. 08/13/17   Derwood Kaplan, MD  Naphazoline HCl (NAPHCON OP) Place 1-4 drops into both eyes as needed (for allergies).     [provider]  nebivolol (BYSTOLIC) 2.5 MG tablet Take 1.25 mg by mouth daily.    [provider]  olmesartan (BENICAR) 40 MG tablet Take 40 mg by mouth daily.    [provider]  omeprazole (PRILOSEC) 10 MG capsule Take 10 mg by mouth at bedtime.    [provider]  oxyCODONE (ROXICODONE) 5 MG immediate release tablet Take 1 tablet (5 mg total) by mouth every 8 (eight) hours as needed for up to 10 doses for severe pain. 07/19/20   Shamir Tuzzolino, DO  oxyCODONE-acetaminophen (PERCOCET/ROXICET) 5-325 MG tablet Take 1 tablet by mouth every 12 (twelve) hours as needed for severe pain. 08/13/17   Derwood Kaplan, MD  Polyethylene Glycol 400 (BLINK TEARS OP) Place 1 drop into both eyes as needed (for dry eyes).    [provider]  predniSONE (DELTASONE) 50 MG tablet 50 mg tomorrow, taper by 10 mg daily until down to 20 mg. Then stop and resume 15 mg. 02/13/16   Calvert Cantor, MD  Probiotic Product (PROBIOTIC DAILY PO) Take 1 capsule by mouth daily.     [provider]  Psyllium (METAMUCIL FIBER PO) Take 1 each by mouth 2 (two) times daily.     [provider]    Allergies    Floxin [ofloxacin], Ceftin [cefuroxime axetil], Ciprofloxacin, Cymbalta [duloxetine hcl], Detrol [tolterodine], Fish allergy, Levaquin [levofloxacin], Mobic [meloxicam], Penicillins, Percocet [oxycodone-acetaminophen], Rocephin [ceftriaxone], Septra [sulfamethoxazole-trimethoprim], Sulfa antibiotics, Dilaudid [hydromorphone], Morphine and related, and Pantoprazole  Review of Systems   Review of Systems  Constitutional: Negative for fever and weight loss.  Cardiovascular: Negative for chest pain.  Gastrointestinal: Negative for abdominal pain and bowel incontinence.  Genitourinary: Negative for bladder incontinence, dysuria and pelvic pain.  Musculoskeletal: Positive for back pain.  Neurological: Negative for tingling, weakness, numbness, headaches and paresthesias.     Physical Exam Updated Vital Signs BP 100/63 (BP Location: Left Arm)   Pulse 77   Temp 98.4 F (36.9 C)   Resp 20   Ht 5' (1.524 m)   Wt 71.2 kg   LMP  (LMP Unknown)   SpO2 97%   BMI 30.66 kg/m   Physical Exam Constitutional:      General: She is not in acute distress.    Appearance: She is not ill-appearing.  Musculoskeletal:        General: Tenderness present.  Neurological:     General: No focal deficit present.     Mental Status: She is alert.     Sensory: No sensory deficit.     Motor: No weakness.     ED Results / Procedures / Treatments   Labs (all labs ordered are listed, but only abnormal results are displayed) Labs Reviewed - No data to display  EKG None  Radiology No results found.  Procedures Procedures   Medications Ordered in ED Medications  oxyCODONE (Oxy IR/ROXICODONE) immediate release tablet 5 mg (has no administration in time range)    ED Course  I have reviewed the triage vital signs and the nursing notes.  Pertinent labs & imaging results that were available during my care of the patient were reviewed by me and considered in my medical decision making (see chart for details).    MDM Rules/Calculators/A&P                          Rosalia Mcavoy is here with ongoing low back pain.  Has no compression fracture in her lumbar spine.  Tylenol 3 has not helped much at home.  No cauda equina symptoms.  She is due for epidural this week.  Will discontinue Tylenol 3 and switch her over to oxycodone and have her take Tylenol separately.  Overall she appears very well.  Recommend lidocaine patches as well.  Discharged from the ED in good condition.  Understands return precautions.  This chart was dictated using voice recognition software.  Despite best efforts to proofread,  errors can occur which can change the documentation meaning.   Final Clinical Impression(s) / ED Diagnoses Final diagnoses:  Acute low back pain, unspecified back pain  laterality, unspecified whether sciatica present    Rx / DC Orders ED Discharge Orders         Ordered    oxyCODONE (ROXICODONE) 5 MG immediate release tablet  Every 8 hours PRN,   Status:  Discontinued        07/19/20 1618    oxyCODONE (ROXICODONE) 5 MG immediate release tablet  Every 8 hours PRN        07/19/20 1618           Joselle Deeds, DO 07/19/20 1622

## 2020-07-20 ENCOUNTER — Other Ambulatory Visit (HOSPITAL_BASED_OUTPATIENT_CLINIC_OR_DEPARTMENT_OTHER): Payer: Self-pay

## 2020-07-20 MED ORDER — PFIZER-BIONT COVID-19 VAC-TRIS 30 MCG/0.3ML IM SUSP
INTRAMUSCULAR | 0 refills | Status: AC
  Filled 2020-07-20: qty 0.3, 1d supply, fill #0

## 2020-08-18 IMAGING — DX PORTABLE CHEST - 1 VIEW
1 series · 1 of 1 positions shown · non-contrast
Comparison: 04/02/2018

CLINICAL DATA: Cough

EXAM:
PORTABLE CHEST 1 VIEW

[chest ap]
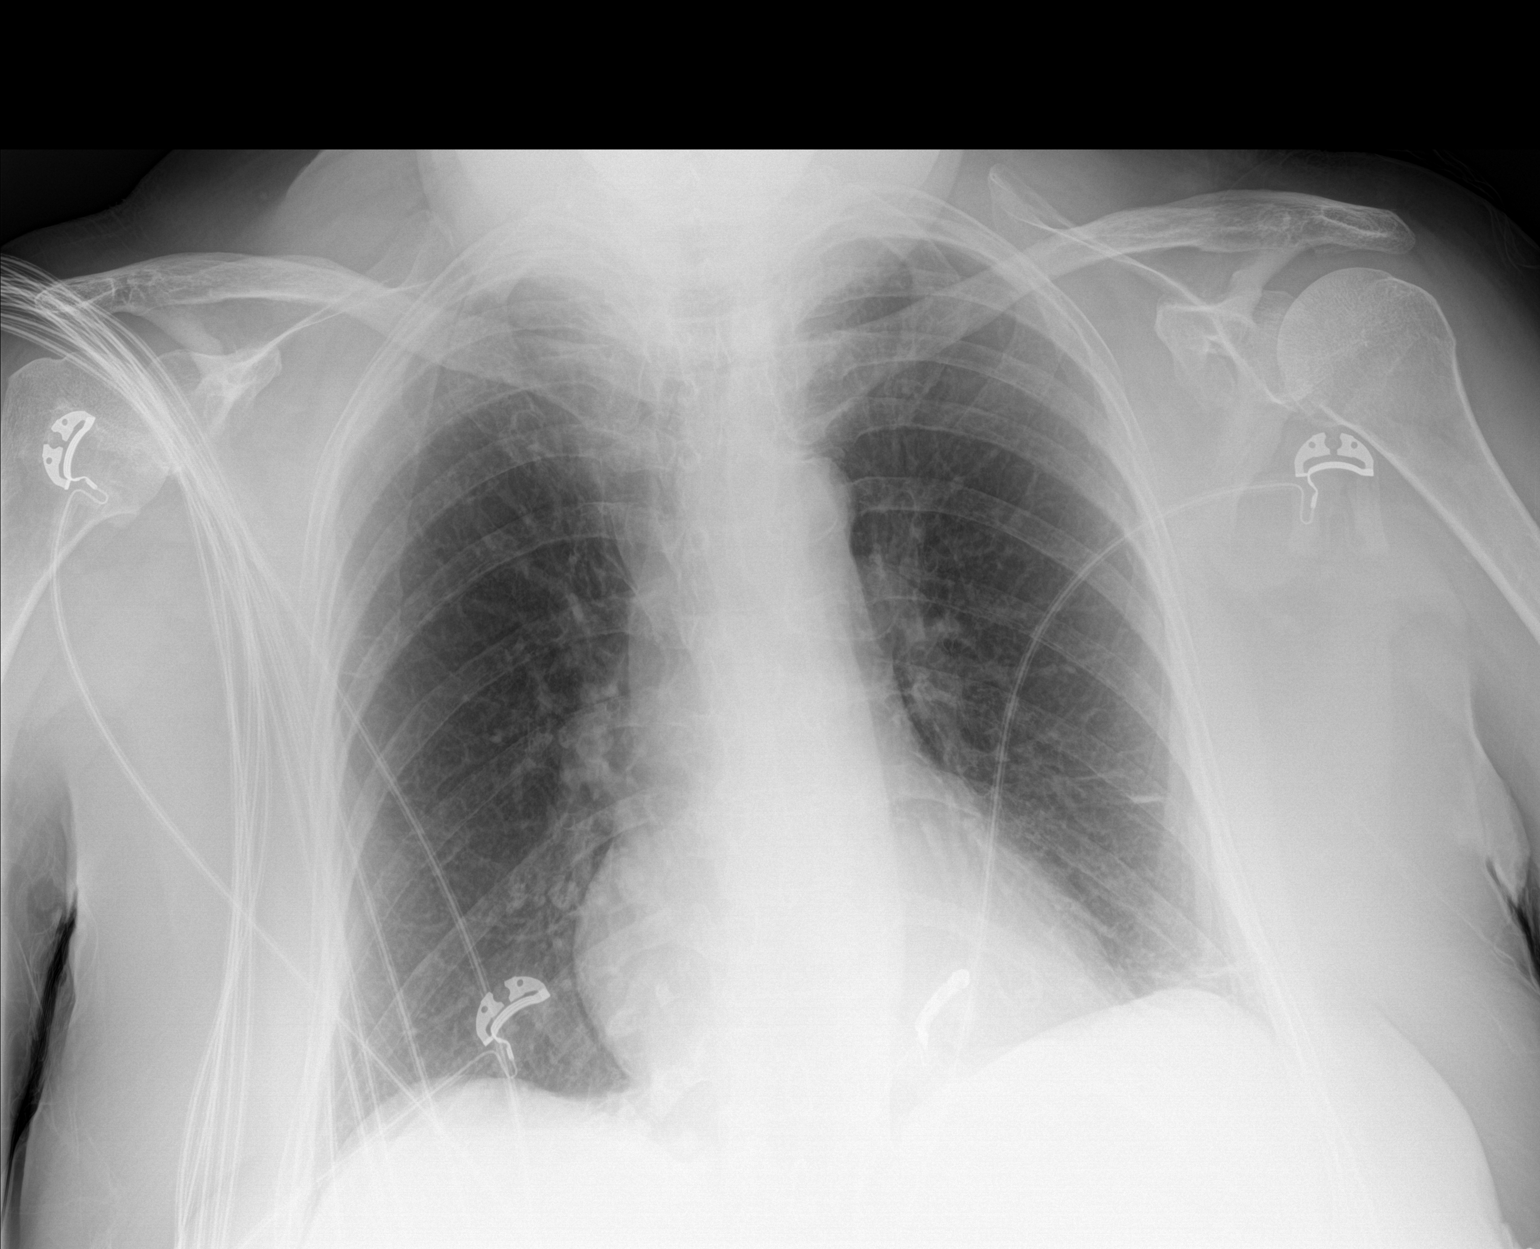

[1 of 1 positions shown; findings below may reference images not displayed]

FINDINGS: The heart size and mediastinal contours are within normal limits.
Both lungs are clear. The visualized skeletal structures are
unremarkable.
IMPRESSION: No active disease.

## 2020-10-26 ENCOUNTER — Other Ambulatory Visit (HOSPITAL_BASED_OUTPATIENT_CLINIC_OR_DEPARTMENT_OTHER): Payer: Self-pay
